# Patient Record
Sex: Male | Born: 1940 | Race: White | Hispanic: No | Marital: Married | Smoking: Never smoker
Health system: Southern US, Community
[De-identification: ages and names within clinical notes are randomized; demographics above are authoritative.]

## PROBLEM LIST (undated history)

## (undated) DIAGNOSIS — E785 Hyperlipidemia, unspecified: Secondary | ICD-10-CM

## (undated) DIAGNOSIS — I499 Cardiac arrhythmia, unspecified: Secondary | ICD-10-CM

## (undated) DIAGNOSIS — C801 Malignant (primary) neoplasm, unspecified: Secondary | ICD-10-CM

## (undated) DIAGNOSIS — N400 Enlarged prostate without lower urinary tract symptoms: Secondary | ICD-10-CM

## (undated) DIAGNOSIS — N189 Chronic kidney disease, unspecified: Secondary | ICD-10-CM

## (undated) DIAGNOSIS — T7840XA Allergy, unspecified, initial encounter: Secondary | ICD-10-CM

## (undated) DIAGNOSIS — D649 Anemia, unspecified: Secondary | ICD-10-CM

## (undated) DIAGNOSIS — K5790 Diverticulosis of intestine, part unspecified, without perforation or abscess without bleeding: Secondary | ICD-10-CM

## (undated) DIAGNOSIS — I10 Essential (primary) hypertension: Secondary | ICD-10-CM

## (undated) HISTORY — DX: Essential (primary) hypertension: I10

## (undated) HISTORY — DX: Diverticulosis of intestine, part unspecified, without perforation or abscess without bleeding: K57.90

## (undated) HISTORY — DX: Chronic kidney disease, unspecified: N18.9

## (undated) HISTORY — PX: TONSILLECTOMY: SUR1361

## (undated) HISTORY — DX: Hyperlipidemia, unspecified: E78.5

## (undated) HISTORY — DX: Cardiac arrhythmia, unspecified: I49.9

## (undated) HISTORY — DX: Benign prostatic hyperplasia without lower urinary tract symptoms: N40.0

## (undated) HISTORY — DX: Anemia, unspecified: D64.9

## (undated) HISTORY — DX: Malignant (primary) neoplasm, unspecified: C80.1

## (undated) HISTORY — DX: Allergy, unspecified, initial encounter: T78.40XA

## (undated) HISTORY — PX: SKIN CANCER EXCISION: SHX779

---

## 2008-04-29 ENCOUNTER — Ambulatory Visit: Payer: Self-pay | Admitting: Internal Medicine

## 2008-05-22 ENCOUNTER — Ambulatory Visit: Payer: Self-pay | Admitting: Internal Medicine

## 2010-06-08 ENCOUNTER — Emergency Department (HOSPITAL_COMMUNITY)
Admission: EM | Admit: 2010-06-08 | Discharge: 2010-06-08 | Payer: Self-pay | Source: Home / Self Care | Admitting: Emergency Medicine

## 2010-06-16 ENCOUNTER — Encounter
Admission: RE | Admit: 2010-06-16 | Discharge: 2010-06-16 | Payer: Self-pay | Source: Home / Self Care | Attending: Internal Medicine | Admitting: Internal Medicine

## 2010-07-21 NOTE — Procedures (Signed)
Summary: Colonoscopy   Colonoscopy  Procedure date:  05/22/2008  Findings:      Location:  [facility].    Procedures Next Due Date:    Colonoscopy: 05/2013  Patient Name: Patrick Rios, Patrick Rios MRN:  Procedure Procedures: Colonoscopy CPT: 40981.  Personnel: Endoscopist: Wilhemina Bonito. Marina Goodell, MD.  Referred By: Barry Dienes Eloise Harman, MD.  Exam Location: Exam performed in Outpatient Clinic. Outpatient  Patient Consent: Procedure, Alternatives, Risks and Benefits discussed, consent obtained, from patient. Consent was obtained by the RN.  Indications  Surveillance of: Reports colonoscopies since age 66. Last one about 6 yrs ago at [facility]. Reports polyps and diverticulosis. No reports available for review.  History  Current Medications: Patient is not currently taking Coumadin.  Pre-Exam Physical: Performed May 22, 2008. Cardio-pulmonary exam, Rectal exam, HEENT exam , Abdominal exam, Mental status exam WNL.  Comments: Pt. history reviewed/updated, physical exam performed prior to initiation of sedation?yes Exam Exam: Extent of exam reached: Cecum, extent intended: Cecum.  The cecum was identified by appendiceal orifice and IC valve. Patient position: on left side. Time to Cecum: 00:03:48. Time for Withdrawl: 00:09:58. Colon retroflexion performed. Images taken. ASA Classification: II. Tolerance: excellent.  Monitoring: Pulse and BP monitoring, Oximetry used. Supplemental O2 given.  Colon Prep Used Movi prep for colon prep. Prep results: excellent.  Sedation Meds: Patient assessed and found to be appropriate for moderate (conscious) sedation. Fentanyl 75 mcg. given IV. Versed 8 mg. given IV.  Findings - DIVERTICULOSIS: Cecum to Sigmoid Colon. ICD9: Diverticulosis, Colon: 562.10. Comments: marked changes.  NORMAL EXAM: Cecum.  NORMAL EXAM: Rectum.   Assessment  Diagnoses: 562.10: Diverticulosis, Colon. marked changes.   Comments: NO POLYPS  SEEN Events  Unplanned Interventions: No intervention was required.  Unplanned Events: There were no complications. Plans Disposition: After procedure patient sent to recovery. After recovery patient sent home.  Scheduling/Referral: Colonoscopy, to Wilhemina Bonito. Marina Goodell, MD, IN 5 YEARS (HX POLYPS, DIVERTICULOSIS),   Comments: PATIENT SHOULD TRY TO GET OLD COLONOSCOPY/PATH RECORDS FOR MY REVIEW   cc: Barry Dienes. Eloise Harman, MD  This report was created from the original endoscopy report, which was reviewed and signed by the above listed endoscopist.

## 2010-07-21 NOTE — Miscellaneous (Signed)
Summary: previsit  Clinical Lists Changes  Medications: Added new medication of MOVIPREP 100 GM  SOLR (PEG-KCL-NACL-NASULF-NA ASC-C) As per prep instructions. - Signed Rx of MOVIPREP 100 GM  SOLR (PEG-KCL-NACL-NASULF-NA ASC-C) As per prep instructions.;  #1 x 0;  Signed;  Entered by: Burman Foster RN;  Authorized by: Hilarie Fredrickson MD;  Method used: Electronically to [facility] [address] [place], [place]  [id number], Ph: [phone], Fax: [phone]    Prescriptions: MOVIPREP 100 GM  SOLR (PEG-KCL-NACL-NASULF-NA ASC-C) As per prep instructions.  #1 x 0   Entered by:   Burman Foster RN   Authorized by:   Hilarie Fredrickson MD   Signed by:   Burman Foster RN on 04/29/2008   Method used:   Electronically to        [facility]* (retail)       [address]       [place], [place]  [id number]       Ph: [phone]       Fax: [phone]   RxID:   [id number]

## 2010-08-31 LAB — DIFFERENTIAL
Basophils Absolute: 0 10*3/uL (ref 0.0–0.1)
Basophils Relative: 1 % (ref 0–1)
Eosinophils Absolute: 0.2 10*3/uL (ref 0.0–0.7)
Eosinophils Relative: 4 % (ref 0–5)
Lymphocytes Relative: 35 % (ref 12–46)
Lymphs Abs: 2 10*3/uL (ref 0.7–4.0)
Monocytes Absolute: 0.6 10*3/uL (ref 0.1–1.0)
Monocytes Relative: 10 % (ref 3–12)
Neutro Abs: 3 10*3/uL (ref 1.7–7.7)
Neutrophils Relative %: 51 % (ref 43–77)

## 2010-08-31 LAB — BASIC METABOLIC PANEL
BUN: 12 mg/dL (ref 6–23)
CO2: 25 mEq/L (ref 19–32)
Calcium: 9.2 mg/dL (ref 8.4–10.5)
Chloride: 111 mEq/L (ref 96–112)
Creatinine, Ser: 0.84 mg/dL (ref 0.4–1.5)
GFR calc Af Amer: >60 mL/min (ref >60)
GFR calc non Af Amer: >60 mL/min (ref >60)
Glucose, Bld: 88 mg/dL (ref 70–99)
Potassium: 3.9 mEq/L (ref 3.5–5.1)
Sodium: 142 mEq/L (ref 135–145)

## 2010-08-31 LAB — CBC
HCT: 33.8 % — ABNORMAL LOW (ref 39.0–52.0)
Hemoglobin: 11.3 g/dL — ABNORMAL LOW (ref 13.0–17.0)
MCH: 30.8 pg (ref 26.0–34.0)
MCHC: 33.4 g/dL (ref 30.0–36.0)
MCV: 92.1 fL (ref 78.0–100.0)
Platelets: 216 10*3/uL (ref 150–400)
RBC: 3.67 MIL/uL — ABNORMAL LOW (ref 4.22–5.81)
RDW: 13.7 % (ref 11.5–15.5)
WBC: 5.8 10*3/uL (ref 4.0–10.5)

## 2011-03-01 ENCOUNTER — Encounter: Payer: Self-pay | Admitting: Internal Medicine

## 2011-04-08 ENCOUNTER — Other Ambulatory Visit (INDEPENDENT_AMBULATORY_CARE_PROVIDER_SITE_OTHER): Payer: BC Managed Care – PPO

## 2011-04-08 ENCOUNTER — Encounter: Payer: Self-pay | Admitting: Internal Medicine

## 2011-04-08 ENCOUNTER — Ambulatory Visit (INDEPENDENT_AMBULATORY_CARE_PROVIDER_SITE_OTHER): Payer: BC Managed Care – PPO | Admitting: Internal Medicine

## 2011-04-08 VITALS — BP 126/68 | HR 64 | Ht 66.0 in | Wt 162.0 lb

## 2011-04-08 DIAGNOSIS — K5730 Diverticulosis of large intestine without perforation or abscess without bleeding: Secondary | ICD-10-CM

## 2011-04-08 DIAGNOSIS — R1084 Generalized abdominal pain: Secondary | ICD-10-CM

## 2011-04-08 DIAGNOSIS — Z1389 Encounter for screening for other disorder: Secondary | ICD-10-CM

## 2011-04-08 DIAGNOSIS — Z86010 Personal history of colonic polyps: Secondary | ICD-10-CM

## 2011-04-08 DIAGNOSIS — K5790 Diverticulosis of intestine, part unspecified, without perforation or abscess without bleeding: Secondary | ICD-10-CM

## 2011-04-08 LAB — BASIC METABOLIC PANEL
BUN: 16 mg/dL (ref 6–23)
CO2: 27 mEq/L (ref 19–32)
Calcium: 9.8 mg/dL (ref 8.4–10.5)
Chloride: 106 mEq/L (ref 96–112)
Creatinine, Ser: 0.8 mg/dL (ref 0.4–1.5)
GFR: 103.32 mL/min (ref >60.00)
Glucose, Bld: 85 mg/dL (ref 70–99)
Potassium: 4.2 mEq/L (ref 3.5–5.1)
Sodium: 141 mEq/L (ref 135–145)

## 2011-04-08 NOTE — Progress Notes (Signed)
HISTORY OF PRESENT ILLNESS:  Patrick Rios is a 70 y.o. male with hypertension, hyperlipidemia, and BPH. He presents today regarding chronic abdominal discomfort. Patient was seen on one occasion, 05/22/2008, for screening colonoscopy. At that time, he reported a history of polyps 6 years previous while living in [place]. This most recent colonoscopy revealed marked diverticulosis, but no polyps. Followup in 5 years recommended. The patient reports a chronic history of right-sided abdominal discomfort. He apparently underwent extensive workup while living in [place]. His pain was said to be musculoskeletal. Over the past 8-12 months he reports similar pain with increased frequency and a bit more constant. The discomfort is right-sided with some tenderness and fullness. He reports regular bowel habits. He does take Metamucil regularly. No weight loss or bleeding. No fevers. Outside laboratories from September of 2012 revealed normal comprehensive metabolic panel. Normal CBC except for hemoglobin of 11.7 (11.3 one year previous). Normal urinalysis and PSA. Stool was Hemoccult negative. Patient feels that his discomfort is probably the same as he has had for years though wishes further investigation due to increased frequency. He also reports some migration to the right side and flank area. No hematuria.  REVIEW OF SYSTEMS:  All non-GI ROS negative except for muscle cramps and back pain  Past Medical History  Diagnosis Date  . Hypertension   . Hyperlipidemia   . BPH (benign prostatic hyperplasia)   . Arrhythmia   . Diverticulosis     No past surgical history on file.  Social History Patrick Rios  reports that he has never smoked. He has never used smokeless tobacco. He reports that he drinks alcohol. He reports that he does not use illicit drugs.  family history is not on file.  Allergies  Allergen Reactions  . Aspirin Rash    High dose       PHYSICAL EXAMINATION: Vital  signs: BP 126/68  Pulse 64  Ht 5\' 6"  (1.676 m)  Wt 162 lb (73.483 kg)  BMI 26.15 kg/m2  Constitutional: generally well-appearing, no acute distress Psychiatric: alert and oriented x3, cooperative Eyes: extraocular movements intact, anicteric, conjunctiva pink Mouth: oral pharynx moist, no lesions Neck: supple no lymphadenopathy Cardiovascular: heart regular rate and rhythm, no murmur Lungs: clear to auscultation bilaterally Abdomen: soft, nontender, nondistended, no obvious ascites, no peritoneal signs, normal bowel sounds, no organomegaly Rectal: Ommitted Extremities: no lower extremity edema bilaterally Skin: no lesions on visible extremities Neuro: No focal deficits. No asterixis.     ASSESSMENT:  #1. Chronic right-sided abdominal discomfort. Today without worrisome features. Previous colonoscopy as described. It may be musculoskeletal in nature. Possibly diverticular spasm. #2. Pandiverticulosis #3. Remote history of polyps, type unknown   PLAN:  #1. Contrast-enhanced CT scan of the abdomen and pelvis. If no significant abnormality, then provide reassurance and follow expectantly

## 2011-04-08 NOTE — Patient Instructions (Signed)
  You have been scheduled for a CT scan of the abdomen and pelvis at [facility] ([address]---this is in the same building as [facility]ural [facility]).   You are scheduled on 04/12/11 at 2:00 pm. You should arrive 15 minutes prior to your appointment time for registration. Please follow the written instructions below on the day of your exam:  WARNING: IF YOU ARE ALLERGIC TO IODINE/X-RAY DYE, PLEASE NOTIFY RADIOLOGY IMMEDIATELY AT [phone]! YOU WILL BE GIVEN A 13 HOUR PREMEDICATION PREP.  1) Do not eat or drink anything after 10:00 am (4 hours prior to your test) 2) You have been given 2 bottles of oral contrast to drink. The solution may taste better if refrigerated, but do NOT add ice or any other liquid to this solution. Shake well before drinking.    Drink 1 bottle of contrast @ 12 noon (2 hours prior to your exam)  Drink 1 bottle of contrast @ 1:00 pm (1 hour prior to your exam)  You may take any medications as prescribed with a small amount of water except for the following: Metformin, Glucophage, Glucovance, Avandamet, Riomet, Fortamet, Actoplus Met, Janumet, Glumetza or Metaglip. The above medications must be held the day of the exam AND 48 hours after the exam.  The purpose of you drinking the oral contrast is to aid in the visualization of your intestinal tract. The contrast solution may cause some diarrhea. Before your exam is started, you will be given a small amount of fluid to drink. Depending on your individual set of symptoms, you may also receive an intravenous injection of x-ray contrast/dye. Plan on being at [facility] for 30 minutes or long, depending on the type of exam you are having performed.  If you have any questions regarding your exam or if you need to reschedule, you may call the CT department at [phone] between the hours of 8:00 am and 5:00 pm, Monday-Friday.  Go to the basement floor and have labs drawn today  (BUN/Creatinine)    ________________________________________________________________________

## 2011-04-12 ENCOUNTER — Telehealth: Payer: Self-pay

## 2011-04-12 ENCOUNTER — Ambulatory Visit (INDEPENDENT_AMBULATORY_CARE_PROVIDER_SITE_OTHER)
Admission: RE | Admit: 2011-04-12 | Discharge: 2011-04-12 | Disposition: A | Discharge status: Home / Self Care | Payer: BC Managed Care – PPO | Source: Ambulatory Visit | Attending: Internal Medicine | Admitting: Internal Medicine

## 2011-04-12 DIAGNOSIS — R1084 Generalized abdominal pain: Secondary | ICD-10-CM

## 2011-04-12 MED ORDER — IOHEXOL 300 MG/ML  SOLN
100.0000 mL | Freq: Once | INTRAMUSCULAR | Status: AC | PRN
  Administered 2011-04-12: 100 mL via INTRAVENOUS

## 2011-04-12 NOTE — Telephone Encounter (Signed)
Pt aware and would like referral to [facility]. Will call pt back once appt made.

## 2011-04-12 NOTE — Telephone Encounter (Signed)
Message copied by Michele Mcalpine on Mon Apr 12, 2011  4:55 PM ------      Message from: Hilarie Fredrickson      Created: Mon Apr 12, 2011  4:35 PM       Let patient noted that his CT scan looks okay except for the presence of a gallstone. It is possible that his right-sided pain is related to gallstones, though his story is a bit atypical. It might be best, if he is interested, and obtaining a surgical opinion. I would have Dr. Johna Sheriff see him

## 2011-04-13 NOTE — Telephone Encounter (Signed)
Pt scheduled to see Dr. Johna Sheriff 05/06/11 arrival time 8:45am, appt time 9:15am. Pt aware of appt date and time. Appt scheduled with [facility].

## 2011-05-06 ENCOUNTER — Ambulatory Visit (INDEPENDENT_AMBULATORY_CARE_PROVIDER_SITE_OTHER): Payer: BC Managed Care – PPO | Admitting: General Surgery

## 2011-05-06 ENCOUNTER — Encounter (INDEPENDENT_AMBULATORY_CARE_PROVIDER_SITE_OTHER): Payer: Self-pay | Admitting: General Surgery

## 2011-05-06 VITALS — BP 122/68 | HR 60 | Temp 97.4°F | Resp 16 | Ht 67.0 in | Wt 162.1 lb

## 2011-05-06 DIAGNOSIS — R1011 Right upper quadrant pain: Secondary | ICD-10-CM

## 2011-05-06 DIAGNOSIS — K8020 Calculus of gallbladder without cholecystitis without obstruction: Secondary | ICD-10-CM

## 2011-05-06 NOTE — Progress Notes (Signed)
Subjective:   Right upper quadrant abdominal pain  Patient ID: Patrick Rios, male   DOB: Oct 23, 1940, 70 y.o.   MRN: [id number]  HPI Patient is a 70 year old male here through the courtesy of Dr. Marina Goodell for abdominal pain and gallstones. The patient states that actually for a number of years he has had mild occasional aching pain in his right upper quadrant.However in recent months this has gradually worsened. He describes fairly constant aching discomfort in his right upper quadrant that radiates around to his right flank and down to his lower abdomen. It is never severe. It is worse with eating.He has some gas and bloating. No nausea or vomiting.Bowel movements are okay and he had a recent colonoscopy within the last couple of years.The patient underwent CT scan to evaluate the pain which has revealed cholelithiasis without any complicating factors.  Past Medical History  Diagnosis Date  . Hypertension   . Hyperlipidemia   . BPH (benign prostatic hyperplasia)   . Arrhythmia   . Diverticulosis   . Anemia     borderline   Past Surgical History  Procedure Date  . Tonsillectomy    Current Outpatient Prescriptions  Medication Sig Dispense Refill  . Ascorbic Acid (VITAMIN C) 100 MG tablet Take 500 mg by mouth daily.       Marland Kitchen aspirin 81 MG tablet Take 81 mg by mouth daily.        . B Complex Vitamins (VITAMIN-B COMPLEX PO) Take by mouth.        . Calcium Carbonate-Vitamin D (CALTRATE 600+D) 600-400 MG-UNIT per tablet Take 1 tablet by mouth daily.        . cetirizine (ZYRTEC) 10 MG chewable tablet Chew 10 mg by mouth daily.        . Cholecalciferol (VITAMIN D) 2000 UNITS CAPS Take 1 capsule by mouth 2 (two) times daily.        . fenofibrate (TRICOR) 145 MG tablet Take 145 mg by mouth daily.        . Garlic 2000 MG CAPS Take 1 capsule by mouth daily.        Marland Kitchen lisinopril (PRINIVIL,ZESTRIL) 2.5 MG tablet Take 2.5 mg by mouth daily.        . metoprolol (TOPROL-XL) 50 MG 24 hr tablet Take 50 mg by  mouth daily.        . Multiple Vitamins-Iron (DAILY MULTIVITAMINS/IRON PO) Take 1 tablet by mouth daily.        . saw palmetto 160 MG capsule Take 160 mg by mouth 2 (two) times daily.        . simvastatin (ZOCOR) 20 MG tablet        Allergies  Allergen Reactions  . Aspirin Rash    High dose   History  Substance Use Topics  . Smoking status: Never Smoker   . Smokeless tobacco: Never Used  . Alcohol Use: 7.0 oz/week    14 drink(s) per week     Review of Systems  Constitutional: Negative for fever, fatigue and unexpected weight change.  HENT: Negative.   Respiratory: Negative.   Cardiovascular: Positive for palpitations. Negative for chest pain and leg swelling.  Gastrointestinal: Positive for abdominal pain and abdominal distention. Negative for nausea, vomiting, diarrhea, constipation and blood in stool.  Genitourinary: Negative.   Musculoskeletal: Negative.        Objective:   Physical Exam General: Normal weight Caucasian male in no distress. Skin: Warm and dry without rash or infection HEENT: No palpable masses  or thyromegaly. Sclera nonicteric. Lymph nodes: No cervical, supraclavicular, or inguinal nodes palpable Lungs: Clear without wheezing or increased work of breathing Cardiovascular: Regular rate and rhythm. No murmur. No JVD or edema. Peripheral pulses intact. Abdomen: Moderate diastases. Mild right upper quadrant tenderness to deep palpation. No discernible masses or organomegaly. No hernias expected. Extremities: No joint swelling deformity or edema Neurologic: Alert and fully oriented. Gait normal.      Assessment:     70 year old male with persistent and worsening right upper quadrant aching discomfort worse with meals. This appears consistent with chronic cholecystitis/biliary colic and he has gallstones confirmed on CT and no other source identified for his pain. I believe he would be best served with laparoscopic cholecystectomy in an effort to relieve  his symptoms and prevent complications from his gallstones. I discussed this with the patient including the nature of the procedure, indications, and risks of anesthetic complications, bleeding, infection, possibly for open procedure, bile leak, bile duct injury and others. He was given literature during the procedure. He is in agreement and will schedule this at his convenience.    Plan:     Laparoscopic cholecystectomy with cholangiogram under general anesthesia as an outpatient.

## 2011-05-06 NOTE — Patient Instructions (Signed)

## 2011-06-08 ENCOUNTER — Other Ambulatory Visit (INDEPENDENT_AMBULATORY_CARE_PROVIDER_SITE_OTHER): Payer: Self-pay | Admitting: General Surgery

## 2011-06-08 DIAGNOSIS — K8010 Calculus of gallbladder with chronic cholecystitis without obstruction: Secondary | ICD-10-CM

## 2011-06-08 HISTORY — PX: CHOLECYSTECTOMY: SHX55

## 2011-07-01 ENCOUNTER — Ambulatory Visit (INDEPENDENT_AMBULATORY_CARE_PROVIDER_SITE_OTHER): Payer: BC Managed Care – PPO | Admitting: General Surgery

## 2011-07-01 ENCOUNTER — Encounter (INDEPENDENT_AMBULATORY_CARE_PROVIDER_SITE_OTHER): Payer: Self-pay | Admitting: General Surgery

## 2011-07-01 VITALS — BP 128/72 | HR 56 | Temp 97.6°F | Resp 12 | Ht 67.0 in | Wt 148.6 lb

## 2011-07-01 DIAGNOSIS — Z09 Encounter for follow-up examination after completed treatment for conditions other than malignant neoplasm: Secondary | ICD-10-CM

## 2011-07-01 NOTE — Progress Notes (Signed)
Patient returns 3 weeks following elective laparoscopic cholecystectomy for chronic cholecystitis. He is getting along very well. He feels that his preoperative pain has been completely relieved. He is getting back to normal activity and not having pain, nausea, or fever.  Pathology showed cholesterolosis and chronic cholecystitis.  On exam he appears well. Abdomen is soft and nontender. Wounds well healed.  Assessment and plan: Doing well following laparoscopic cholecystectomy with relief of symptoms and no complications. No restrictions at this point. Return p.r.n.

## 2011-08-20 ENCOUNTER — Encounter (INDEPENDENT_AMBULATORY_CARE_PROVIDER_SITE_OTHER): Payer: Self-pay | Admitting: General Surgery

## 2011-11-30 ENCOUNTER — Encounter: Payer: Self-pay | Admitting: Cardiovascular Disease

## 2012-03-26 ENCOUNTER — Emergency Department (HOSPITAL_COMMUNITY)
Admission: EM | Admit: 2012-03-26 | Discharge: 2012-03-26 | Disposition: A | Discharge status: Home / Self Care | Payer: MEDICARE | Attending: Emergency Medicine | Admitting: Emergency Medicine

## 2012-03-26 ENCOUNTER — Encounter (HOSPITAL_COMMUNITY): Payer: Self-pay | Admitting: *Deleted

## 2012-03-26 ENCOUNTER — Emergency Department (HOSPITAL_COMMUNITY): Payer: MEDICARE

## 2012-03-26 DIAGNOSIS — Z79899 Other long term (current) drug therapy: Secondary | ICD-10-CM | POA: Insufficient documentation

## 2012-03-26 DIAGNOSIS — N201 Calculus of ureter: Secondary | ICD-10-CM | POA: Insufficient documentation

## 2012-03-26 DIAGNOSIS — I10 Essential (primary) hypertension: Secondary | ICD-10-CM | POA: Insufficient documentation

## 2012-03-26 DIAGNOSIS — N138 Other obstructive and reflux uropathy: Secondary | ICD-10-CM | POA: Insufficient documentation

## 2012-03-26 DIAGNOSIS — N401 Enlarged prostate with lower urinary tract symptoms: Secondary | ICD-10-CM | POA: Insufficient documentation

## 2012-03-26 DIAGNOSIS — N1330 Unspecified hydronephrosis: Secondary | ICD-10-CM | POA: Insufficient documentation

## 2012-03-26 DIAGNOSIS — Z9089 Acquired absence of other organs: Secondary | ICD-10-CM | POA: Insufficient documentation

## 2012-03-26 DIAGNOSIS — E785 Hyperlipidemia, unspecified: Secondary | ICD-10-CM | POA: Insufficient documentation

## 2012-03-26 LAB — COMPREHENSIVE METABOLIC PANEL
ALT: 15 U/L (ref 0–53)
AST: 34 U/L (ref 0–37)
Albumin: 4.4 g/dL (ref 3.5–5.2)
Alkaline Phosphatase: 43 U/L (ref 39–117)
BUN: 18 mg/dL (ref 6–23)
CO2: 22 mEq/L (ref 19–32)
Calcium: 10.4 mg/dL (ref 8.4–10.5)
Chloride: 108 mEq/L (ref 96–112)
Creatinine, Ser: 0.69 mg/dL (ref 0.50–1.35)
GFR calc Af Amer: >90 mL/min (ref >90)
GFR calc non Af Amer: >90 mL/min (ref >90)
Glucose, Bld: 93 mg/dL (ref 70–99)
Potassium: 4.2 mEq/L (ref 3.5–5.1)
Sodium: 142 mEq/L (ref 135–145)
Total Bilirubin: 0.5 mg/dL (ref 0.3–1.2)
Total Protein: 7.3 g/dL (ref 6.0–8.3)

## 2012-03-26 LAB — CBC WITH DIFFERENTIAL/PLATELET
Basophils Absolute: 0 10*3/uL (ref 0.0–0.1)
Basophils Absolute: 0 10*3/uL (ref 0.0–0.1)
Basophils Relative: 0 % (ref 0–1)
Basophils Relative: 0 % (ref 0–1)
Eosinophils Absolute: 0.1 10*3/uL (ref 0.0–0.7)
Eosinophils Absolute: 0.1 10*3/uL (ref 0.0–0.7)
Eosinophils Relative: 2 % (ref 0–5)
Eosinophils Relative: 2 % (ref 0–5)
HCT: 35.4 % — ABNORMAL LOW (ref 39.0–52.0)
HCT: 34.5 % — ABNORMAL LOW (ref 39.0–52.0)
Hemoglobin: 12 g/dL — ABNORMAL LOW (ref 13.0–17.0)
Hemoglobin: 11.6 g/dL — ABNORMAL LOW (ref 13.0–17.0)
Lymphocytes Relative: 23 % (ref 12–46)
Lymphocytes Relative: 28 % (ref 12–46)
Lymphs Abs: 2 10*3/uL (ref 0.7–4.0)
Lymphs Abs: 2.4 10*3/uL (ref 0.7–4.0)
MCH: 31.1 pg (ref 26.0–34.0)
MCH: 30.9 pg (ref 26.0–34.0)
MCHC: 33.9 g/dL (ref 30.0–36.0)
MCHC: 33.6 g/dL (ref 30.0–36.0)
MCV: 91.7 fL (ref 78.0–100.0)
MCV: 91.8 fL (ref 78.0–100.0)
Monocytes Absolute: 0.7 10*3/uL (ref 0.1–1.0)
Monocytes Absolute: 0.8 10*3/uL (ref 0.1–1.0)
Monocytes Relative: 7 % (ref 3–12)
Monocytes Relative: 10 % (ref 3–12)
Neutro Abs: 6.1 10*3/uL (ref 1.7–7.7)
Neutro Abs: 5 10*3/uL (ref 1.7–7.7)
Neutrophils Relative %: 68 % (ref 43–77)
Neutrophils Relative %: 60 % (ref 43–77)
Platelets: 223 10*3/uL (ref 150–400)
Platelets: 189 10*3/uL (ref 150–400)
RBC: 3.86 MIL/uL — ABNORMAL LOW (ref 4.22–5.81)
RBC: 3.76 MIL/uL — ABNORMAL LOW (ref 4.22–5.81)
RDW: 13.5 % (ref 11.5–15.5)
RDW: 13.3 % (ref 11.5–15.5)
WBC: 8.9 10*3/uL (ref 4.0–10.5)
WBC: 8.3 10*3/uL (ref 4.0–10.5)

## 2012-03-26 LAB — BASIC METABOLIC PANEL
BUN: 16 mg/dL (ref 6–23)
CO2: 24 mEq/L (ref 19–32)
Calcium: 10.1 mg/dL (ref 8.4–10.5)
Chloride: 106 mEq/L (ref 96–112)
Creatinine, Ser: 0.68 mg/dL (ref 0.50–1.35)
GFR calc Af Amer: >90 mL/min (ref >90)
GFR calc non Af Amer: >90 mL/min (ref >90)
Glucose, Bld: 90 mg/dL (ref 70–99)
Potassium: 3.7 mEq/L (ref 3.5–5.1)
Sodium: 141 mEq/L (ref 135–145)

## 2012-03-26 MED ORDER — OXYCODONE-ACETAMINOPHEN 5-325 MG PO TABS: 1.0000 | ORAL_TABLET | Freq: Four times a day (QID) | ORAL | Status: DC | PRN

## 2012-03-26 MED ORDER — IOHEXOL 300 MG/ML  SOLN
100.0000 mL | Freq: Once | INTRAMUSCULAR | Status: AC | PRN
  Administered 2012-03-26: 100 mL via INTRAVENOUS

## 2012-03-26 MED ORDER — SODIUM CHLORIDE 0.9 % IV SOLN: INTRAVENOUS | Status: DC

## 2012-03-26 MED ORDER — ONDANSETRON HCL 4 MG/2ML IJ SOLN
4.0000 mg | Freq: Once | INTRAMUSCULAR | Status: AC
  Administered 2012-03-26: 4 mg via INTRAVENOUS
  Filled 2012-03-26: qty 2

## 2012-03-26 MED ORDER — TAMSULOSIN HCL 0.4 MG PO CAPS: 0.4000 mg | ORAL_CAPSULE | Freq: Every day | ORAL | Status: DC

## 2012-03-26 MED ORDER — MORPHINE SULFATE 4 MG/ML IJ SOLN
4.0000 mg | Freq: Once | INTRAMUSCULAR | Status: AC
  Administered 2012-03-26: 4 mg via INTRAVENOUS
  Filled 2012-03-26: qty 1

## 2012-03-26 MED ORDER — SODIUM CHLORIDE 0.9 % IV BOLUS (SEPSIS)
500.0000 mL | Freq: Once | INTRAVENOUS | Status: AC
  Administered 2012-03-26: 500 mL via INTRAVENOUS

## 2012-03-26 NOTE — ED Provider Notes (Signed)
History     CSN: [id number]  Arrival date & time 03/26/12  1031   First MD Initiated Contact with Patient 03/26/12 1248      Chief Complaint  Patient presents with  . Abdominal Pain    (Consider location/radiation/quality/duration/timing/severity/associated sxs/prior treatment) Patient is a 71 y.o. male presenting with abdominal pain.  Abdominal Pain The primary symptoms of the illness include abdominal pain and nausea. The primary symptoms of the illness do not include shortness of breath, vomiting, diarrhea or dysuria.  Additional symptoms associated with the illness include constipation. Symptoms associated with the illness do not include back pain.  patient developed right-sided abdominal pain yesterday. It is dull. It is worse in the lower abdomen. No fevers. No vomiting. He has had some constipation and nausea.he's had a previous cholecystectomy. He still has his appendix.  Past Medical History  Diagnosis Date  . Hypertension   . Hyperlipidemia   . BPH (benign prostatic hyperplasia)   . Arrhythmia   . Diverticulosis   . Anemia     borderline    Past Surgical History  Procedure Date  . Tonsillectomy   . Cholecystectomy 06/08/11    History reviewed. No pertinent family history.  History  Substance Use Topics  . Smoking status: Never Smoker   . Smokeless tobacco: Never Used  . Alcohol Use: 7.0 oz/week    14 drink(s) per week      Review of Systems  Constitutional: Positive for appetite change. Negative for activity change.  HENT: Negative for neck stiffness.   Eyes: Negative for pain.  Respiratory: Negative for chest tightness and shortness of breath.   Cardiovascular: Negative for chest pain and leg swelling.  Gastrointestinal: Positive for nausea, abdominal pain and constipation. Negative for vomiting and diarrhea.  Genitourinary: Negative for dysuria and flank pain.  Musculoskeletal: Negative for back pain.  Skin: Negative for rash.  Neurological:  Negative for weakness, numbness and headaches.  Psychiatric/Behavioral: Negative for behavioral problems.    Allergies  Aspirin  Home Medications   Current Outpatient Rx  Name Route Sig Dispense Refill  . VITAMIN C 100 MG PO TABS Oral Take 500 mg by mouth daily.     . ASPIRIN 81 MG PO TABS Oral Take 81 mg by mouth daily.      Marland Kitchen VITAMIN-B COMPLEX PO Oral Take 1 tablet by mouth daily.     Marland Kitchen CALCIUM CARBONATE-VITAMIN D 600-400 MG-UNIT PO TABS Oral Take 1 tablet by mouth daily.     Marland Kitchen CETIRIZINE HCL 10 MG PO CHEW Oral Chew 10 mg by mouth daily.      Marland Kitchen VITAMIN D 2000 UNITS PO CAPS Oral Take 1 capsule by mouth daily.     . FENOFIBRATE 145 MG PO TABS Oral Take 145 mg by mouth daily.      Marland Kitchen GARLIC 2000 MG PO CAPS Oral Take 1 capsule by mouth daily.      Marland Kitchen LISINOPRIL 2.5 MG PO TABS Oral Take 2.5 mg by mouth daily.      Marland Kitchen METOPROLOL SUCCINATE ER 50 MG PO TB24 Oral Take 50 mg by mouth daily.      Marland Kitchen DAILY MULTIVITAMINS/IRON PO Oral Take 1 tablet by mouth daily.      . SAW PALMETTO (SERENOA REPENS) 160 MG PO CAPS Oral Take 160 mg by mouth daily.     Marland Kitchen SIMVASTATIN 20 MG PO TABS Oral Take 20 mg by mouth daily.     . OXYCODONE-ACETAMINOPHEN 5-325 MG PO TABS Oral Take  1-2 tablets by mouth every 6 (six) hours as needed for pain. 20 tablet 0  . TAMSULOSIN HCL 0.4 MG PO CAPS Oral Take 1 capsule (0.4 mg total) by mouth daily. 7 capsule 0    BP 163/81  Pulse 65  Temp 97.7 F (36.5 C) (Oral)  Resp 18  SpO2 97%  Physical Exam  Nursing note and vitals reviewed. Constitutional: He is oriented to person, place, and time. He appears well-developed and well-nourished.  HENT:  Head: Normocephalic and atraumatic.  Eyes: EOM are normal. Pupils are equal, round, and reactive to light.  Neck: Normal range of motion. Neck supple.  Cardiovascular: Normal rate, regular rhythm and normal heart sounds.   No murmur heard. Pulmonary/Chest: Effort normal and breath sounds normal.  Abdominal: Soft. Bowel sounds are  normal. He exhibits no distension and no mass. There is tenderness. There is no rebound and no guarding.       Tenderness to right lower quadrant without rebound or guarding. No rash. No masses.  Musculoskeletal: Normal range of motion. He exhibits no edema.  Neurological: He is alert and oriented to person, place, and time. No cranial nerve deficit.  Skin: Skin is warm and dry.  Psychiatric: He has a normal mood and affect.    ED Course  Procedures (including critical care time)  Labs Reviewed  CBC WITH DIFFERENTIAL - Abnormal; Notable for the following:    RBC 3.86 (*)     Hemoglobin 12.0 (*)     HCT 35.4 (*)     All other components within normal limits  CBC WITH DIFFERENTIAL - Abnormal; Notable for the following:    RBC 3.76 (*)     Hemoglobin 11.6 (*)     HCT 34.5 (*)     All other components within normal limits  COMPREHENSIVE METABOLIC PANEL  BASIC METABOLIC PANEL  LAB REPORT - SCANNED   Ct Abdomen Pelvis W Contrast  03/26/2012  *RADIOLOGY REPORT*  Clinical Data: Right lower quadrant pain since last night.  Nausea. No vomiting.  Constipation.  Pain.  History of hypertension, cholecystectomy, BPH.  CT ABDOMEN AND PELVIS WITH CONTRAST  Technique:  Multidetector CT imaging of the abdomen and pelvis was performed following the standard protocol during bolus administration of intravenous contrast.  Contrast: OMNIPAQUE IOHEXOL 300 MG/ML  SOLN  Comparison: 04/12/2011  Findings: Lung bases are unremarkable.  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  The ureters join together at the level of L4-5. At the junction of the ureters, there is a 5 mm calculus in the upper pole moiety ureter, causing mild pelvicaliectasis.  No focal abnormality identified within the liver, spleen, pancreas, adrenal glands, or kidneys.  Gallbladder is surgically absent.  The stomach and small bowel loops are normal in appearance. The appendix is well seen and has a normal appearance.  Colonic loops  shows scattered diverticula but no evidence for diverticulitis. No evidence for aortic aneurysm.  There is atherosclerotic calcification of the abdominal aorta however. No retroperitoneal or mesenteric adenopathy.  There are mild degenerative changes in the lower thoracic and lumbar spine. There is prostatic enlargement. Prostate calcifications are present.  Urinary bladder has a normal appearance.  IMPRESSION:  1.  Duplicated right renal collecting system.  There is a 5 mm calculus within the upper pole ureter, at the junction with the lower pole ureter.  There is mild right hydronephrosis. 2.  Normal appendix. 3.  Prostatic enlargement.   Original Report Authenticated By: Patterson Hammersmith,  M.D.      1. Ureteral stone       MDM  Patient presents with lower abdominal pain nausea without vomiting. Also has had some constipation. This morning the pain increased. Urinalysis does not show infection. CT scan showed a 5 mm ureteral calculus. He does have a duplicate collecting system. After discussion of urology patient will followup in the office.        Juliet Rude. Rubin Payor, MD 03/27/12 [phone]

## 2012-03-26 NOTE — ED Notes (Signed)
Pt in c/o RLQ abd pain since last night, also nausea, denies vomiting, states he has been constipated over last few days which is abnormal for him. Pain increased this am.

## 2013-04-11 ENCOUNTER — Encounter: Payer: Self-pay | Admitting: Internal Medicine

## 2013-09-05 ENCOUNTER — Encounter: Payer: Self-pay | Admitting: Cardiovascular Disease

## 2013-11-17 ENCOUNTER — Encounter: Payer: Self-pay | Admitting: Internal Medicine

## 2014-03-07 ENCOUNTER — Encounter: Payer: Self-pay | Admitting: Internal Medicine

## 2014-03-21 ENCOUNTER — Encounter: Payer: Self-pay | Admitting: Internal Medicine

## 2014-05-07 ENCOUNTER — Ambulatory Visit (AMBULATORY_SURGERY_CENTER): Payer: Self-pay | Admitting: *Deleted

## 2014-05-07 VITALS — Ht 65.0 in | Wt 158.2 lb

## 2014-05-07 DIAGNOSIS — Z86010 Personal history of colonic polyps: Secondary | ICD-10-CM

## 2014-05-07 MED ORDER — MOVIPREP 100 G PO SOLR: ORAL | Status: DC

## 2014-05-07 NOTE — Progress Notes (Signed)
No egg or soy allergy  No anesthesia or intubation problems per pt  No diet medications taken  Registered in EMMI   

## 2014-05-21 ENCOUNTER — Ambulatory Visit (AMBULATORY_SURGERY_CENTER): Payer: BC Managed Care – PPO | Admitting: Internal Medicine

## 2014-05-21 ENCOUNTER — Encounter: Payer: Self-pay | Admitting: Internal Medicine

## 2014-05-21 VITALS — BP 130/73 | HR 59 | Temp 98.7°F | Resp 16 | Ht 65.0 in | Wt 158.0 lb

## 2014-05-21 DIAGNOSIS — Z86010 Personal history of colonic polyps: Secondary | ICD-10-CM

## 2014-05-21 MED ORDER — SODIUM CHLORIDE 0.9 % IV SOLN: 500.0000 mL | INTRAVENOUS | Status: DC

## 2014-05-21 NOTE — Op Note (Signed)
[place]  [address]. [place], [address]   COLONOSCOPY PROCEDURE REPORT  PATIENT: Patrick Rios, Patrick Rios  MR#: [id number] BIRTHDATE: 1940-07-28 , 43  yrs. old GENDER: male ENDOSCOPIST: Eustace Quail, MD REFERRED [id number] Program Recall PROCEDURE DATE:  05/21/2014 PROCEDURE:   Colonoscopy, surveillance First Screening Colonoscopy - Avg.  risk and is 50 yrs.  old or older - No.  Prior Negative Screening - Now for repeat screening. N/A  History of Adenoma - Now for follow-up colonoscopy & has been > or = to 3 yrs.  N/A  Polyps Removed Today? No.  Recommend repeat exam, <10 yrs? No. ASA CLASS:   Class II INDICATIONS:surveillance colonoscopy based on a history of colonic polyp(s).   reports multiple prior colonoscopies elswhere and hx of polyps (no details). Last colonoscopy here 05-2008 - no polyps. No family hx MEDICATIONS: Monitored anesthesia care and Propofol 250 mg IV  DESCRIPTION OF PROCEDURE:   After the risks benefits and alternatives of the procedure were thoroughly explained, informed consent was obtained.  The digital rectal exam revealed no abnormalities of the rectum.   The LB ZM-CE022 [id number]  endoscope was introduced through the anus and advanced to the cecum, which was identified by both the appendix and ileocecal valve. No adverse events experienced.   The quality of the prep was excellent, using MoviPrep  The instrument was then slowly withdrawn as the colon was fully examined.    COLON FINDINGS: There was severe diverticulosis noted throughout the entire examined colon.   The examination was otherwise normal. Retroflexed views revealed internal hemorrhoids. The time to cecum=3 minutes 04 seconds.  Withdrawal time=9 minutes 50 seconds. The scope was withdrawn and the procedure completed.  COMPLICATIONS: There were no immediate complications.  ENDOSCOPIC IMPRESSION: 1.   Severe diverticulosis was noted throughout the entire  examined colon 2.   The examination was otherwise normal  RECOMMENDATIONS: 1.  Return to the care of your primary provider.  GI follow up as needed  eSigned:  Eustace Quail, MD 05/21/2014 11:37 AM   cc: Leanna Battles, MD and The Patient

## 2014-05-21 NOTE — Progress Notes (Signed)
Procedure ends, to recovery, report given and VSS. 

## 2014-05-21 NOTE — Patient Instructions (Signed)

## 2014-05-22 ENCOUNTER — Telehealth: Payer: Self-pay

## 2014-05-22 NOTE — Telephone Encounter (Signed)
  Follow up Call-  Call back number 05/21/2014  Post procedure Call Back phone  # [phone]  Permission to leave phone message Yes     Patient questions:  Do you have a fever, pain , or abdominal swelling? No. Pain Score  0 *  Have you tolerated food without any problems? Yes.    Have you been able to return to your normal activities? Yes.    Do you have any questions about your discharge instructions: Diet   No. Medications  No. Follow up visit  No.  Do you have questions or concerns about your Care? No.  Actions: * If pain score is 4 or above: No action needed, pain <4.

## 2015-04-08 ENCOUNTER — Other Ambulatory Visit: Payer: Self-pay | Admitting: Internal Medicine

## 2015-04-08 DIAGNOSIS — M545 Low back pain: Secondary | ICD-10-CM

## 2015-04-21 ENCOUNTER — Ambulatory Visit
Admission: RE | Admit: 2015-04-21 | Discharge: 2015-04-21 | Disposition: A | Discharge status: Home / Self Care | Payer: BLUE CROSS/BLUE SHIELD | Source: Ambulatory Visit | Attending: Internal Medicine | Admitting: Internal Medicine

## 2015-04-21 DIAGNOSIS — M545 Low back pain: Secondary | ICD-10-CM

## 2015-04-25 ENCOUNTER — Other Ambulatory Visit: Payer: Self-pay | Admitting: Internal Medicine

## 2015-04-25 DIAGNOSIS — G8929 Other chronic pain: Secondary | ICD-10-CM

## 2015-04-25 DIAGNOSIS — M545 Low back pain: Principal | ICD-10-CM

## 2016-05-04 ENCOUNTER — Other Ambulatory Visit: Payer: Self-pay | Admitting: Internal Medicine

## 2016-05-04 DIAGNOSIS — R1903 Right lower quadrant abdominal swelling, mass and lump: Secondary | ICD-10-CM

## 2016-05-05 ENCOUNTER — Ambulatory Visit
Admission: RE | Admit: 2016-05-05 | Discharge: 2016-05-05 | Disposition: A | Discharge status: Home / Self Care | Payer: BLUE CROSS/BLUE SHIELD | Source: Ambulatory Visit | Attending: Internal Medicine | Admitting: Internal Medicine

## 2016-05-05 DIAGNOSIS — R1903 Right lower quadrant abdominal swelling, mass and lump: Secondary | ICD-10-CM

## 2016-05-05 MED ORDER — IOPAMIDOL (ISOVUE-300) INJECTION 61%
100.0000 mL | Freq: Once | INTRAVENOUS | Status: AC | PRN
  Administered 2016-05-05: 100 mL via INTRAVENOUS

## 2016-08-17 ENCOUNTER — Other Ambulatory Visit: Payer: Self-pay | Admitting: Internal Medicine

## 2016-08-17 DIAGNOSIS — M5416 Radiculopathy, lumbar region: Secondary | ICD-10-CM

## 2016-08-25 ENCOUNTER — Ambulatory Visit
Admission: RE | Admit: 2016-08-25 | Discharge: 2016-08-25 | Disposition: A | Discharge status: Home / Self Care | Payer: BLUE CROSS/BLUE SHIELD | Source: Ambulatory Visit | Attending: Internal Medicine | Admitting: Internal Medicine

## 2016-08-25 DIAGNOSIS — M5416 Radiculopathy, lumbar region: Secondary | ICD-10-CM

## 2016-08-25 MED ORDER — IOPAMIDOL (ISOVUE-M 200) INJECTION 41%
1.0000 mL | Freq: Once | INTRAMUSCULAR | Status: AC
  Administered 2016-08-25: 1 mL via EPIDURAL

## 2016-08-25 MED ORDER — METHYLPREDNISOLONE ACETATE 40 MG/ML INJ SUSP (RADIOLOG
120.0000 mg | Freq: Once | INTRAMUSCULAR | Status: AC
  Administered 2016-08-25: 120 mg via EPIDURAL

## 2016-08-25 NOTE — Discharge Instructions (Signed)

## 2017-10-19 ENCOUNTER — Telehealth: Payer: Self-pay

## 2017-10-19 NOTE — Telephone Encounter (Signed)
SENT REFERRAL TO SCHEDULING 

## 2017-10-20 ENCOUNTER — Encounter: Payer: Self-pay | Admitting: Cardiovascular Disease

## 2017-10-20 ENCOUNTER — Ambulatory Visit: Payer: Medicare Other | Admitting: Cardiovascular Disease

## 2017-10-20 DIAGNOSIS — R42 Dizziness and giddiness: Secondary | ICD-10-CM | POA: Insufficient documentation

## 2017-10-20 DIAGNOSIS — E7800 Pure hypercholesterolemia, unspecified: Secondary | ICD-10-CM

## 2017-10-20 DIAGNOSIS — I10 Essential (primary) hypertension: Secondary | ICD-10-CM | POA: Insufficient documentation

## 2017-10-20 DIAGNOSIS — E785 Hyperlipidemia, unspecified: Secondary | ICD-10-CM | POA: Insufficient documentation

## 2017-10-20 NOTE — Addendum Note (Signed)
Addended by: Zebedee Iba on: 10/20/2017 03:21 PM   Modules accepted: Orders

## 2017-10-20 NOTE — Progress Notes (Signed)
10/20/2017 Patrick Rios   [birth year]  [id number]  Primary Physician Leanna Battles, MD Primary Cardiologist: Lorretta Harp MD Lupe Carney, [place]  HPI:  Patrick Rios is a 77 y.o. mildly overweight married Caucasian male father of 2 mg father for grandchildren referred by Earle Gell nurse practitioner for bradycardia and dizziness. 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 He apparently saw Dr. Kristopher Oppenheim  10 years ago for "arrhythmia" and a 2-D echo was performed which was normal and he had no issues since.this was factors include treated hypertension and hyperlipidemia. There is no family history. He does not smoke. He stopped raking heavily 3 years ago. He denies chest pain or shortness of breath. Does have hepatic steatosis. He noticed onset of lightheadedness and dizziness several weeks ago. She is exacerbated by certain movements but he has chronic low level baseline dizziness on a daily basis. Does not some orthostatic.   Current Meds  Medication Sig  . aspirin 81 MG tablet Take 81 mg by mouth daily.    . B Complex Vitamins (VITAMIN-B COMPLEX PO) Take 1 tablet by mouth daily.   . Calcium Carbonate-Vitamin D (CALTRATE 600+D) 600-400 MG-UNIT per tablet Take 1 tablet by mouth daily.   . cetirizine (ZYRTEC) 10 MG chewable tablet Chew 10 mg by mouth daily.    . fenofibrate (TRICOR) 145 MG tablet Take 145 mg by mouth daily.    . metoprolol (TOPROL-XL) 50 MG 24 hr tablet Take 50 mg by mouth daily.    . Multiple Vitamins-Iron (DAILY MULTIVITAMINS/IRON PO) Take 1 tablet by mouth daily.    . Psyllium (METAMUCIL PO) Take by mouth 2 (two) times daily.  . simvastatin (ZOCOR) 20 MG tablet Take 20 mg by mouth daily.      No Known Allergies  Social History   Socioeconomic History  . Marital status: Married    Spouse name: Not on file  . Number of children: 2  . Years of education: Not on file  . Highest education level: Not on  file  Occupational History  . Occupation: Retired    Fish farm manager: RETIRED  Social Needs  . Financial resource strain: Not on file  . Food insecurity:    Worry: Not on file    Inability: Not on file  . Transportation needs:    Medical: Not on file    Non-medical: Not on file  Tobacco Use  . Smoking status: Never Smoker  . Smokeless tobacco: Never Used  Substance and Sexual Activity  . Alcohol use: Yes    Alcohol/week: 7.0 oz    Types: 14 Standard drinks or equivalent per week  . Drug use: No  . Sexual activity: Not on file  Lifestyle  . Physical activity:    Days per week: Not on file    Minutes per session: Not on file  . Stress: Not on file  Relationships  . Social connections:    Talks on phone: Not on file    Gets together: Not on file    Attends religious service: Not on file    Active member of club or organization: Not on file    Attends meetings of clubs or organizations: Not on file    Relationship status: Not on file  . Intimate partner violence:    Fear of current or ex partner: Not on file    Emotionally abused: Not on file    Physically abused: Not on file  Forced sexual activity: Not on file  Other Topics Concern  . Not on file  Social History Narrative  . Not on file     Review of Systems: General: negative for chills, fever, night sweats or weight changes.  Cardiovascular: negative for chest pain, dyspnea on exertion, edema, orthopnea, palpitations, paroxysmal nocturnal dyspnea or shortness of breath Dermatological: negative for rash Respiratory: negative for cough or wheezing Urologic: negative for hematuria Abdominal: negative for nausea, vomiting, diarrhea, bright red blood per rectum, melena, or hematemesis Neurologic: negative for visual changes, syncope, or dizziness All other systems reviewed and are otherwise negative except as noted above.    Blood pressure (!) 152/66, pulse (!) 54, height 5\' 6"  (1.676 m), weight 165 lb 6.4 oz (75 kg).    General appearance: alert and no distress Neck: no adenopathy, no carotid bruit, no JVD, supple, symmetrical, trachea midline and thyroid not enlarged, symmetric, no tenderness/mass/nodules Lungs: clear to auscultation bilaterally Heart: regular rate and rhythm, S1, S2 normal, no murmur, click, rub or gallop Extremities: extremities normal, atraumatic, no cyanosis or edema Pulses: 2+ and symmetric Skin: Skin color, texture, turgor normal. No rashes or lesions Neurologic: Alert and oriented X 3, normal strength and tone. Normal symmetric reflexes. Normal coordination and gait  EKG sinus bradycardia 54 without ST or T-wave changes. I personally reviewed this EKG.  ASSESSMENT AND PLAN:   Essential hypertension History of essential hypertension on metoprolol blood pressure measured at 152/66.  Hyperlipidemia History of hyperlipidemia on statin therapy  Dizziness [name] was referred by Earle Gell nurse practitioner at [facility] because of bradycardia and dizziness. He is on a beta blocker with heart rates in the 50s. He's had chronic dizziness for a couple weeks exacerbated by sudden movements. This does not sound orthostatic. It sounds more like an inner ear issue. I do not think he needs further cardiovascular workup at this time I recommended that he see an ENT physician.      Lorretta Harp MD FACP,FACC,FAHA, [facility] 10/20/2017 2:53 PM

## 2017-10-20 NOTE — Patient Instructions (Signed)
Medication Instructions: Your physician recommends that you continue on your current medications as directed. Please refer to the Current Medication list given to you today.   Follow-Up: Your physician recommends that you schedule a follow-up appointment as needed with Dr. Berry.    

## 2017-10-20 NOTE — Assessment & Plan Note (Signed)
Patrick Rios was referred by Earle Gell nurse practitioner at [facility] because of bradycardia and dizziness. He is on a beta blocker with heart rates in the 50s. He's had chronic dizziness for a couple weeks exacerbated by sudden movements. This does not sound orthostatic. It sounds more like an inner ear issue. I do not think he needs further cardiovascular workup at this time I recommended that he see an ENT physician.

## 2017-10-20 NOTE — Assessment & Plan Note (Signed)
History of hyperlipidemia on statin therapy. 

## 2017-10-20 NOTE — Assessment & Plan Note (Signed)
History of essential hypertension on metoprolol blood pressure measured at 152/66.

## 2018-10-24 IMAGING — XA Imaging study
2 series · 2 of 2 positions shown · non-contrast
Comparison: none

CLINICAL DATA: Lumbosacral spondylosis without myelopathy. Right
greater than left low back pain with intermittent lower extremity
pain. Right central disc protrusion at L4-5 on MRI.

[Series 1: ortho standard · 1 of 1 slices shown (1 of 2)]
[im 1/1]
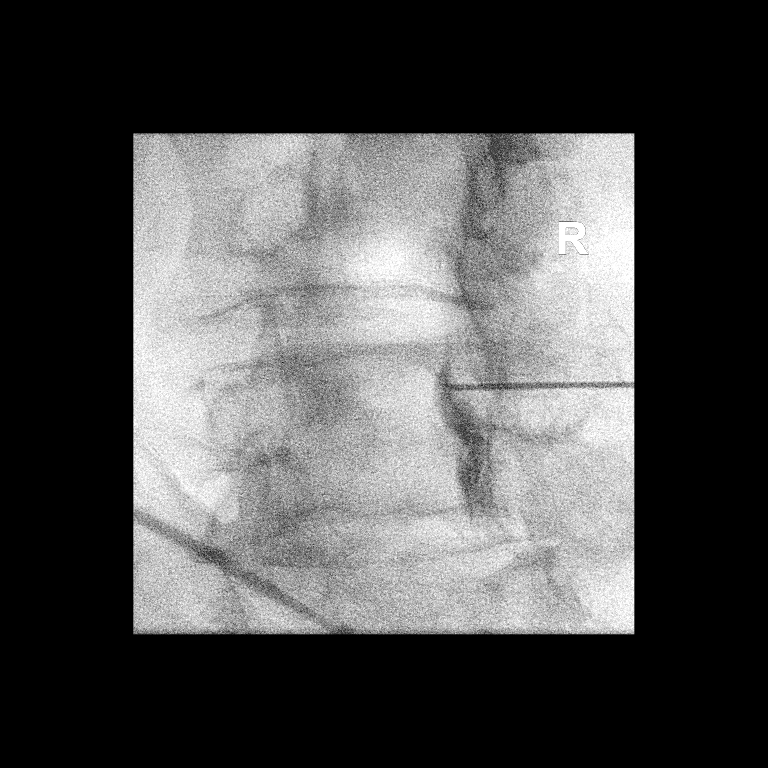

[Series 2: ortho standard · 1 of 1 slices shown (2 of 2)]
[im 1/1]
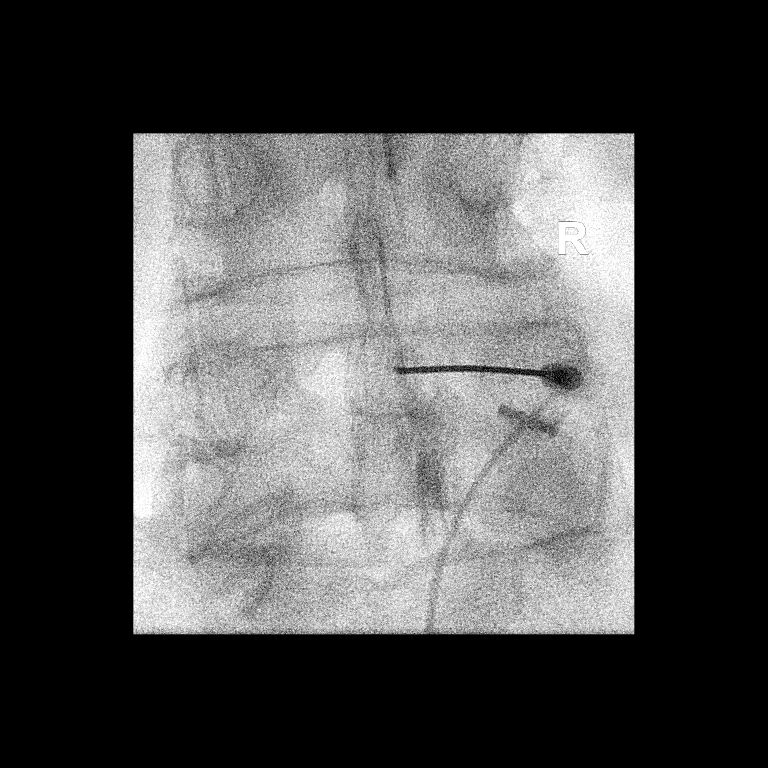

[2 of 2 positions shown; findings below may reference images not displayed]

FLUOROSCOPY TIME:  Radiation Exposure Index (as provided by the
fluoroscopic device): 11.70 microGray*m^2

Fluoroscopy Time (in minutes and seconds):  9 seconds

PROCEDURE:
The procedure, risks, benefits, and alternatives were explained to
the patient. Questions regarding the procedure were encouraged and
answered. The patient understands and consents to the procedure.

LUMBAR EPIDURAL INJECTION:

An interlaminar approach was performed on the right at L4-5. The
overlying skin was cleansed and anesthetized. A 3.5 inch 20 gauge
epidural needle was advanced using loss-of-resistance technique.

DIAGNOSTIC EPIDURAL INJECTION:

Injection of Isovue-M 200 shows a good epidural pattern with spread
above and below the level of needle placement, primarily on the
right. No vascular opacification is seen.

THERAPEUTIC EPIDURAL INJECTION:

120 mg of Depo-Medrol mixed with 3 mL of 1% lidocaine were
instilled. The procedure was well-tolerated, and the patient was
discharged thirty minutes following the injection in good condition.

COMPLICATIONS:
None
IMPRESSION: Technically successful lumbar interlaminar epidural injection on the
right at L4-5.

## 2019-06-20 ENCOUNTER — Telehealth: Payer: Self-pay | Admitting: Cardiovascular Disease

## 2019-06-20 NOTE — Telephone Encounter (Signed)
New Message:     Pt would like to switch from your service to Dr Acie Fredrickson service. Is this alright with you?

## 2019-06-20 NOTE — Telephone Encounter (Signed)
It looks like I have seen him in the past.  This is OK with me.

## 2019-06-20 NOTE — Telephone Encounter (Signed)
New Message:      Pt would like to switch from Dr [name] service to you. Is this alright with you?

## 2019-06-24 NOTE — Telephone Encounter (Signed)
Fine with me

## 2019-06-27 ENCOUNTER — Encounter: Payer: Self-pay | Admitting: Internal Medicine

## 2019-07-24 ENCOUNTER — Telehealth: Payer: Self-pay | Admitting: Cardiovascular Disease

## 2019-07-24 NOTE — Telephone Encounter (Signed)
Close this encounter

## 2019-08-01 ENCOUNTER — Ambulatory Visit: Payer: Medicare Other | Admitting: Internal Medicine

## 2019-08-01 ENCOUNTER — Other Ambulatory Visit: Payer: Self-pay

## 2019-08-01 ENCOUNTER — Encounter: Payer: Self-pay | Admitting: Internal Medicine

## 2019-08-01 VITALS — BP 148/62 | HR 60 | Temp 98.1°F | Ht 65.0 in | Wt 166.0 lb

## 2019-08-01 DIAGNOSIS — Z01818 Encounter for other preprocedural examination: Secondary | ICD-10-CM

## 2019-08-01 DIAGNOSIS — Z86010 Personal history of colonic polyps: Secondary | ICD-10-CM

## 2019-08-01 DIAGNOSIS — R195 Other fecal abnormalities: Secondary | ICD-10-CM

## 2019-08-01 DIAGNOSIS — R1319 Other dysphagia: Secondary | ICD-10-CM

## 2019-08-01 DIAGNOSIS — R1310 Dysphagia, unspecified: Secondary | ICD-10-CM

## 2019-08-01 DIAGNOSIS — R140 Abdominal distension (gaseous): Secondary | ICD-10-CM

## 2019-08-01 DIAGNOSIS — K219 Gastro-esophageal reflux disease without esophagitis: Secondary | ICD-10-CM

## 2019-08-01 MED ORDER — NA SULFATE-K SULFATE-MG SULF 17.5-3.13-1.6 GM/177ML PO SOLN: 1.0000 | Freq: Once | ORAL | 0 refills | Status: AC

## 2019-08-01 NOTE — Progress Notes (Signed)
HISTORY OF PRESENT ILLNESS:  Patrick Rios is a 79 y.o. 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 Philip Aspen regarding Hemoccult positive stool.  Patient has not been seen here since December 2015 when he underwent surveillance colonoscopy.  He had multiple prior colonoscopies in [place] as well as here in 2009.  At the time of his last evaluation he was found to have severe diverticulosis throughout the entire examined colon.  No other abnormalities.  Small internal hemorrhoids.  Because of consecutive negative for neoplasia exams he was felt to have aged out of surveillance.  Part of his routine annual physical evaluation he underwent Hemoccult testing which returned + June 13, 2019.  Review of blood work finds a borderline normal hemoglobin of 12.0.  MCV 93.7.  Patient denies melena.  He notices occasional trivial bright red blood on the tissue about 3 or 4 times per year.  He does have chronic reflux disease for which she is on pantoprazole.  Occasional intermittent solid food dysphagia.  No apparent upper endoscopy previously, here.  He does take baby aspirin but no additional NSAIDs or blood thinners.  Review of outside x-ray file shows CT scan of the abdomen and pelvis with contrast revealed extensive diverticulosis.  Fat-containing inguinal hernias as well.  Hepatic steatosis.  He asked about this.  He is status post cholecystectomy.  Review of outside liver tests are normal.  REVIEW OF SYSTEMS:  All non-GI ROS negative unless otherwise stated in the HPI except for allergies  Past Medical History:  Diagnosis Date  . Allergy   . Anemia    borderline  . Arrhythmia   . BPH (benign prostatic hyperplasia)   . Cancer (HCC)    melanoma- forehead, basal cell- nose, squamous- left of scalp  . Chronic kidney disease    kidney stone  . Diverticulosis   . Hyperlipidemia   . Hypertension      Past Surgical History:  Procedure Laterality Date  . CHOLECYSTECTOMY  06/08/11  . SKIN CANCER EXCISION     x3  . TONSILLECTOMY      Social History Timtohy Stillwagon  reports that he has never smoked. He has never used smokeless tobacco. He reports current alcohol use of about 14.0 standard drinks of alcohol per week. He reports that he does not use drugs.  family history is not on file.  No Known Allergies     PHYSICAL EXAMINATION: Vital signs: BP (!) 148/62   Pulse 60   Temp 98.1 F (36.7 C)   Ht 5\' 5"  (1.651 m)   Wt 166 lb (75.3 kg)   BMI 27.62 kg/m   Constitutional: generally well-appearing, no acute distress Psychiatric: alert and oriented x3, cooperative Eyes: extraocular movements intact, anicteric, conjunctiva pink Mouth: oral pharynx moist, no lesions Neck: supple no lymphadenopathy Cardiovascular: heart regular rate and rhythm, no murmur Lungs: clear to auscultation bilaterally Abdomen: soft, nontender, nondistended, no obvious ascites, no peritoneal signs, normal bowel sounds, no organomegaly Rectal: Deferred until colonoscopy Extremities: no clubbing or cyanosis.  1+ lower extremity edema bilaterally Skin: no lesions on visible extremities Neuro: No focal deficits.  Cranial nerves intact  ASSESSMENT:  1.  Hemoccult positive stool.  Rule out underlying GI mucosal lesion 2.  Minor intermittent rectal bleeding likely due to known hemorrhoids 3.  Extensive diverticulosis.  Previous colonoscopies 2009 and 2015 without neoplasia 4.  Chronic GERD.  Requires PPI. 5.  Intermittent  solid food dysphagia.  Mild and infrequent.  Rule out peptic stricture 6.  Fatty liver on imaging.  Normal liver tests   PLAN:  1.  Reflux precautions 2.  Continue pantoprazole 3.  Schedule upper endoscopy to evaluate chronic GERD, Hemoccult positive stool, and dysphagia.The nature of the procedure, as well as the risks, benefits, and alternatives were carefully and thoroughly  reviewed with the patient. Ample time for discussion and questions allowed. The patient understood, was satisfied, and agreed to proceed. 4.  Schedule colonoscopy to evaluate Hemoccult-positive stool.The nature of the procedure, as well as the risks, benefits, and alternatives were carefully and thoroughly reviewed with the patient. Ample time for discussion and questions allowed. The patient understood, was satisfied, and agreed to proceed. 5.  Discussion on fatty liver A total of 45 minutes was spent preparing to see the patient, reviewing outside tests and x-rays, obtaining and reviewing separately obtained history, performing comprehensive physical examination, counseling the patient regarding the above listed issues and their significance, ordering advanced procedures and discussing their nature in detail, and documenting clinical information in the health record

## 2019-08-01 NOTE — Patient Instructions (Signed)
You have been scheduled for an endoscopy and colonoscopy. Please follow the written instructions given to you at your visit today. Please pick up your prep supplies at the pharmacy within the next 1-3 days. If you use inhalers (even only as needed), please bring them with you on the day of your procedure.  

## 2019-08-07 ENCOUNTER — Ambulatory Visit (INDEPENDENT_AMBULATORY_CARE_PROVIDER_SITE_OTHER): Payer: Medicare Other

## 2019-08-07 DIAGNOSIS — Z1159 Encounter for screening for other viral diseases: Secondary | ICD-10-CM

## 2019-08-08 ENCOUNTER — Telehealth: Payer: Self-pay

## 2019-08-08 DIAGNOSIS — Z01818 Encounter for other preprocedural examination: Secondary | ICD-10-CM

## 2019-08-08 LAB — SARS CORONAVIRUS 2 (TAT 6-24 HRS): SARS Coronavirus 2: NEGATIVE

## 2019-08-08 NOTE — Telephone Encounter (Signed)
Called patient to reschedule procedure and covid test due to weather.

## 2019-08-09 ENCOUNTER — Encounter: Payer: Medicare Other | Admitting: Internal Medicine

## 2019-08-21 ENCOUNTER — Other Ambulatory Visit: Payer: Self-pay

## 2019-08-21 ENCOUNTER — Encounter: Payer: Self-pay | Admitting: Cardiovascular Disease

## 2019-08-21 ENCOUNTER — Ambulatory Visit: Payer: Medicare Other | Admitting: Cardiovascular Disease

## 2019-08-21 VITALS — BP 150/66 | HR 52 | Ht 66.5 in | Wt 164.0 lb

## 2019-08-21 DIAGNOSIS — E782 Mixed hyperlipidemia: Secondary | ICD-10-CM | POA: Diagnosis not present

## 2019-08-21 DIAGNOSIS — I10 Essential (primary) hypertension: Secondary | ICD-10-CM | POA: Diagnosis not present

## 2019-08-21 DIAGNOSIS — R42 Dizziness and giddiness: Secondary | ICD-10-CM | POA: Diagnosis not present

## 2019-08-21 MED ORDER — CHLORTHALIDONE 25 MG PO TABS: 25.0000 mg | ORAL_TABLET | Freq: Every day | ORAL | 3 refills | Status: DC

## 2019-08-21 MED ORDER — POTASSIUM CHLORIDE ER 10 MEQ PO TBCR: 10.0000 meq | EXTENDED_RELEASE_TABLET | Freq: Every day | ORAL | 3 refills | Status: DC

## 2019-08-21 NOTE — Progress Notes (Signed)
Cardiology Office Note:    Date:  08/21/2019   ID:  Patrick Rios, DOB 06/19/1941, MRN [id number]  PCP:  Patrick Battles, MD  Cardiologist:  No primary care provider on file.  Electrophysiologist:  None   Referring MD: Patrick Battles, MD   Chief Complaint  Patient presents with  . Hypertension     History of Present Illness:    Patrick Rios is a 79 y.o. male with a hx of hypertension and bradycardia.  He has had some episodes of dizziness.  Have seen him approximately 10 years ago. Saw Dr. Gwenlyn Rios for some dizziness and bradycardia.  Turned out to be in inner ear issue Resolved after he had some wax cleaned out from his ear  Saw Dr. Stephaney Steven Rios last fall.   Was Rios to have HTN.  Did 24 hr BP monitor . BP has been generally elevated.   Still eats salty foods Patrick Rios every 2 weeks , sausage every 3 days , no hotdogs, no canned foods ,  Has been eating at restaurants 3 times a week ( sandwiches - steak and cheese, [place] , ) No regular exercise  Wife recently diagnosed with alzheimers - he has to stay around the house.   He does all the cooking  waks the dog several times a day - 1/2 mile twice. A day  No CP nor dyspnea  No pnd, orthopnea,  No orthostasis   Has gained weight over this past covid year    Past Medical History:  Diagnosis Date  . Allergy   . Anemia    borderline  . Arrhythmia   . BPH (benign prostatic hyperplasia)   . Cancer (HCC)    melanoma- forehead, basal cell- nose, squamous- left of scalp  . Chronic kidney disease    kidney stone  . Diverticulosis   . Hyperlipidemia   . Hypertension     Past Surgical History:  Procedure Laterality Date  . CHOLECYSTECTOMY  06/08/11  . SKIN CANCER EXCISION     x3  . TONSILLECTOMY      Current Medications: Current Meds  Medication Sig  . B Complex Vitamins (VITAMIN-B COMPLEX PO) Take 1 tablet by mouth daily.   . Calcium Carbonate-Vitamin D (CALTRATE 600+D) 600-400 MG-UNIT per tablet Take 1 tablet by mouth  daily.   . cetirizine (ZYRTEC) 10 MG tablet Take 10 mg by mouth daily.  . fenofibrate (TRICOR) 145 MG tablet Take 145 mg by mouth daily.    Marland Kitchen losartan (COZAAR) 50 MG tablet Take 50 mg by mouth 2 (two) times daily.  . metoprolol (TOPROL-XL) 50 MG 24 hr tablet Take 50 mg by mouth daily.    . Multiple Vitamins-Iron (DAILY MULTIVITAMINS/IRON PO) Take 1 tablet by mouth daily.    . pantoprazole (PROTONIX) 40 MG tablet Take 40 mg by mouth daily.  . Psyllium (METAMUCIL PO) Take by mouth 2 (two) times daily.  . simvastatin (ZOCOR) 20 MG tablet Take 20 mg by mouth daily.   . Vitamin D, Ergocalciferol, (DRISDOL) 1.25 MG (50000 UNIT) CAPS capsule Take 50,000 Units by mouth once a week.     Allergies:   Patient has no known allergies.   Social History   Socioeconomic History  . Marital status: Married    Spouse name: Not on file  . Number of children: 2  . Years of education: Not on file  . Highest education level: Not on file  Occupational History  . Occupation: Retired    Fish farm manager: RETIRED  Tobacco Use  .  Smoking status: Never Smoker  . Smokeless tobacco: Never Used  Substance and Sexual Activity  . Alcohol use: Yes    Alcohol/week: 14.0 standard drinks    Types: 14 Standard drinks or equivalent per week  . Drug use: No  . Sexual activity: Not on file  Other Topics Concern  . Not on file  Social History Narrative  . Not on file   Social Determinants of Health   Financial Resource Strain:   . Difficulty of Paying Living Expenses: Not on file  Food Insecurity:   . Worried About Charity fundraiser in the Last Year: Not on file  . Ran Out of Food in the Last Year: Not on file  Transportation Needs:   . Lack of Transportation (Medical): Not on file  . Lack of Transportation (Non-Medical): Not on file  Physical Activity:   . Days of Exercise per Week: Not on file  . Minutes of Exercise per Session: Not on file  Stress:   . Feeling of Stress : Not on file  Social Connections:     . Frequency of Communication with Friends and Family: Not on file  . Frequency of Social Gatherings with Friends and Family: Not on file  . Attends Religious Services: Not on file  . Active Member of Clubs or Organizations: Not on file  . Attends Archivist Meetings: Not on file  . Marital Status: Not on file     Family History: The patient's family history is negative for Colon cancer, Esophageal cancer, Rectal cancer, and Stomach cancer.  ROS:   Please see the history of present illness.     All other systems reviewed and are negative.  EKGs/Labs/Other Studies Reviewed:    The following studies were reviewed today:   EKG:   August 21, 2019: Sinus bradycardia 52 beats minute.  No ST or T wave changes.  Recent Labs: No results Rios for requested labs within last 8760 hours.  Recent Lipid Panel No results Rios for: CHOL, TRIG, HDL, CHOLHDL, VLDL, LDLCALC, LDLDIRECT  Physical Exam:    VS:  BP (!) 150/66   Pulse (!) 52   Ht 5' 6.5" (1.689 m)   Wt 164 lb (74.4 kg)   SpO2 97%   BMI 26.07 kg/m     Wt Readings from Last 3 Encounters:  08/21/19 164 lb (74.4 kg)  08/01/19 166 lb (75.3 kg)  10/20/17 165 lb 6.4 oz (75 kg)     GEN:  Well nourished, well developed in no acute distress HEENT: Normal NECK: No JVD; No carotid bruits LYMPHATICS: No lymphadenopathy CARDIAC: RRR, no murmurs, rubs, gallops RESPIRATORY:  Clear to auscultation without rales, wheezing or rhonchi  ABDOMEN: Soft, non-tender, non-distended MUSCULOSKELETAL:  No edema; No deformity  SKIN: Warm and dry NEUROLOGIC:  Alert and oriented x 3 PSYCHIATRIC:  Normal affect   ASSESSMENT:    No diagnosis Rios. PLAN:    In order of problems listed above:  1. Essential hypertension:   Patrick Rios is seen back after 10-year absence.  He saw Dr. Alvester Rios several years ago for some dizziness that turned out to be an inner ear issue.  His wife has developed dementia and has been short and largely due to Covid.   He is gained some weight.  He is eating more salt than he should.  We had a long discussion about improving his diet and hopefully his exercise program.  He may need to see if there is a neighbor or church  member who can watch his wife while he goes out for a walk.  I think this will help with weight loss as well as his hypertension.  We talked about reduction in his salt.  We will give him a DASH diet.  I like to start him on chlorthalidone 25 mg a day as well as potassium chloride 10 mEq a day.  We will have him return in approximately 3 weeks for basic metabolic profile and nurse visit blood pressure check.  I encouraged him to walk on a regular basis and to make sure that he is not getting any extra salt.  We will have him see an APP in 3 months.  If his blood pressure remains high then will consider changing the losartan to one of the stronger ARB's ( Valsartan, Telmasartan)    Medication Adjustments/Labs and Tests Ordered: Current medicines are reviewed at length with the patient today.  Concerns regarding medicines are outlined above.  No orders of the defined types were placed in this encounter.  No orders of the defined types were placed in this encounter.   Patient Instructions  Medication Instructions:  Your physician has recommended you make the following change in your medication:  START Chlorthalidone (Hygraton) 25 mg once daily START Kdur (Potassium chloride) 10 mEq once daily  *If you need a refill on your cardiac medications before your next appointment, please call your pharmacy*   Lab Work: Your physician recommends that you return for lab work in: 3 weeks for basic metabolic panel on Friday March 26 at 11:00 am  If you have labs (blood work) drawn today and your tests are completely normal, you will receive your results only by: Marland Kitchen MyChart Message (if you have MyChart) OR . A paper copy in the mail If you have any lab test that is abnormal or we need to change your  treatment, we will call you to review the results.   Testing/Procedures: None Ordered   Follow-Up: Your physician recommends that you return for a follow-up appointment on Friday March 26 at 11:00 am   At [facility], you and your health needs are our priority.  As part of our continuing mission to provide you with exceptional heart care, we have created designated Provider Care Teams.  These Care Teams include your primary Cardiologist (physician) and Advanced Practice Providers (APPs -  Physician Assistants and Nurse Practitioners) who all work together to provide you with the care you need, when you need it.  We recommend signing up for the patient portal called "MyChart".  Sign up information is provided on this After Visit Summary.  MyChart is used to connect with patients for Virtual Visits (Telemedicine).  Patients are able to view lab/test results, encounter notes, upcoming appointments, etc.  Non-urgent messages can be sent to your provider as well.   To learn more about what you can do with MyChart, go to [email or web address].    Your next appointment:   3 month(s)  The format for your next appointment:   In Person  Provider:   Richardson Dopp, PA-C or Robbie Lis, PA-C     DASH Eating Plan DASH stands for "Dietary Approaches to Stop Hypertension." The DASH eating plan is a healthy eating plan that has been shown to reduce high blood pressure (hypertension). It may also reduce your risk for type 2 diabetes, heart disease, and stroke. The DASH eating plan may also help with weight loss. What are tips for following this plan?  General guidelines  Avoid eating more than 2,300 mg (milligrams) of salt (sodium) a day. If you have hypertension, you may need to reduce your sodium intake to 1,500 mg a day.  Limit alcohol intake to no more than 1 drink a day for nonpregnant women and 2 drinks a day for men. One drink equals 12 oz of beer, 5 oz of wine, or 1 oz of hard  liquor.  Work with your health care provider to maintain a healthy body weight or to lose weight. Ask what an ideal weight is for you.  Get at least 30 minutes of exercise that causes your heart to beat faster (aerobic exercise) most days of the week. Activities may include walking, swimming, or biking.  Work with your health care provider or diet and nutrition specialist (dietitian) to adjust your eating plan to your individual calorie needs. Reading food labels   Check food labels for the amount of sodium per serving. Choose foods with less than 5 percent of the Daily Value of sodium. Generally, foods with less than 300 mg of sodium per serving fit into this eating plan.  To find whole grains, look for the word "whole" as the first word in the ingredient list. Shopping  Buy products labeled as "low-sodium" or "no salt added."  Buy fresh foods. Avoid canned foods and premade or frozen meals. Cooking  Avoid adding salt when cooking. Use salt-free seasonings or herbs instead of table salt or sea salt. Check with your health care provider or pharmacist before using salt substitutes.  Do not fry foods. Cook foods using healthy methods such as baking, boiling, grilling, and broiling instead.  Cook with heart-healthy oils, such as olive, canola, soybean, or sunflower oil. Meal planning  Eat a balanced diet that includes: ? 5 or more servings of fruits and vegetables each day. At each meal, try to fill half of your plate with fruits and vegetables. ? Up to 6-8 servings of whole grains each day. ? Less than 6 oz of lean meat, poultry, or fish each day. A 3-oz serving of meat is about the same size as a deck of cards. One egg equals 1 oz. ? 2 servings of low-fat dairy each day. ? A serving of nuts, seeds, or beans 5 times each week. ? Heart-healthy fats. Healthy fats called Omega-3 fatty acids are Rios in foods such as flaxseeds and coldwater fish, like sardines, salmon, and  mackerel.  Limit how much you eat of the following: ? Canned or prepackaged foods. ? Food that is high in trans fat, such as fried foods. ? Food that is high in saturated fat, such as fatty meat. ? Sweets, desserts, sugary drinks, and other foods with added sugar. ? Full-fat dairy products.  Do not salt foods before eating.  Try to eat at least 2 vegetarian meals each week.  Eat more home-cooked food and less restaurant, buffet, and fast food.  When eating at a restaurant, ask that your food be prepared with less salt or no salt, if possible. What foods are recommended? The items listed may not be a complete list. Talk with your dietitian about what dietary choices are best for you. Grains Whole-grain or whole-wheat bread. Whole-grain or whole-wheat pasta. Brown rice. [place]. Bulgur. Whole-grain and low-sodium cereals. Pita bread. Low-fat, low-sodium crackers. Whole-wheat flour tortillas. Vegetables Fresh or frozen vegetables (raw, steamed, roasted, or grilled). Low-sodium or reduced-sodium tomato and vegetable juice. Low-sodium or reduced-sodium tomato sauce and tomato paste. Low-sodium or reduced-sodium canned vegetables. Fruits  All fresh, dried, or frozen fruit. Canned fruit in natural juice (without added sugar). Meat and other protein foods Skinless chicken or [place]. Ground chicken or [place]. Pork with fat trimmed off. Fish and seafood. Egg whites. Dried beans, peas, or lentils. Unsalted nuts, nut butters, and seeds. Unsalted canned beans. Lean cuts of beef with fat trimmed off. Low-sodium, lean deli meat. Dairy Low-fat (1%) or fat-free (skim) milk. Fat-free, low-fat, or reduced-fat cheeses. Nonfat, low-sodium ricotta or cottage cheese. Low-fat or nonfat yogurt. Low-fat, low-sodium cheese. Fats and oils Soft margarine without trans fats. Vegetable oil. Low-fat, reduced-fat, or light mayonnaise and salad dressings (reduced-sodium). Canola, safflower, olive, soybean, and  sunflower oils. Avocado. Seasoning and other foods Herbs. Spices. Seasoning mixes without salt. Unsalted popcorn and pretzels. Fat-free sweets. What foods are not recommended? The items listed may not be a complete list. Talk with your dietitian about what dietary choices are best for you. Grains Baked goods made with fat, such as croissants, muffins, or some breads. Dry pasta or rice meal packs. Vegetables Creamed or fried vegetables. Vegetables in a cheese sauce. Regular canned vegetables (not low-sodium or reduced-sodium). Regular canned tomato sauce and paste (not low-sodium or reduced-sodium). Regular tomato and vegetable juice (not low-sodium or reduced-sodium). Angie Fava. Olives. Fruits Canned fruit in a light or heavy syrup. Fried fruit. Fruit in cream or butter sauce. Meat and other protein foods Fatty cuts of meat. Ribs. Fried meat. Patrick Rios. Sausage. Bologna and other processed lunch meats. Salami. Fatback. Hotdogs. Bratwurst. Salted nuts and seeds. Canned beans with added salt. Canned or smoked fish. Whole eggs or egg yolks. Chicken or [place] with skin. Dairy Whole or 2% milk, cream, and half-and-half. Whole or full-fat cream cheese. Whole-fat or sweetened yogurt. Full-fat cheese. Nondairy creamers. Whipped toppings. Processed cheese and cheese spreads. Fats and oils Butter. Stick margarine. Lard. Shortening. Ghee. Bacon fat. Tropical oils, such as coconut, palm kernel, or palm oil. Seasoning and other foods Salted popcorn and pretzels. Onion salt, garlic salt, seasoned salt, table salt, and sea salt. Worcestershire sauce. Tartar sauce. Barbecue sauce. Teriyaki sauce. Soy sauce, including reduced-sodium. Steak sauce. Canned and packaged gravies. Fish sauce. Oyster sauce. Cocktail sauce. Horseradish that you find on the shelf. Ketchup. Mustard. Meat flavorings and tenderizers. Bouillon cubes. Hot sauce and Tabasco sauce. Premade or packaged marinades. Premade or packaged taco seasonings.  Relishes. Regular salad dressings. Where to find more information:  National Heart, Lung, and [place]: https://wilson-eaton.com/  American Heart Association: www.heart.org Summary  The DASH eating plan is a healthy eating plan that has been shown to reduce high blood pressure (hypertension). It may also reduce your risk for type 2 diabetes, heart disease, and stroke.  With the DASH eating plan, you should limit salt (sodium) intake to 2,300 mg a day. If you have hypertension, you may need to reduce your sodium intake to 1,500 mg a day.  When on the DASH eating plan, aim to eat more fresh fruits and vegetables, whole grains, lean proteins, low-fat dairy, and heart-healthy fats.  Work with your health care provider or diet and nutrition specialist (dietitian) to adjust your eating plan to your individual calorie needs. This information is not intended to replace advice given to you by your health care provider. Make sure you discuss any questions you have with your health care provider. Document Revised: 05/20/2017 Document Reviewed: 05/31/2016 Elsevier Patient Education  2020 Petrey, Mertie Moores, MD  08/21/2019 12:00 PM    [facility]  

## 2019-08-21 NOTE — Patient Instructions (Addendum)
Medication Instructions:  Your physician has recommended you make the following change in your medication:  START Chlorthalidone (Hygraton) 25 mg once daily in the morning START Kdur (Potassium chloride) 10 mEq once daily in the morning  *If you need a refill on your cardiac medications before your next appointment, please call your pharmacy*   Lab Work: Your physician recommends that you return for lab work in: 3 weeks for basic metabolic panel on Friday March 26 at 11:00 am  If you have labs (blood work) drawn today and your tests are completely normal, you will receive your results only by: Marland Kitchen MyChart Message (if you have MyChart) OR . A paper copy in the mail If you have any lab test that is abnormal or we need to change your treatment, we will call you to review the results.   Testing/Procedures: None Ordered   Follow-Up: Your physician recommends that you return for a follow-up appointment on Friday March 26 at 11:00 am   At [facility], you and your health needs are our priority.  As part of our continuing mission to provide you with exceptional heart care, we have created designated Provider Care Teams.  These Care Teams include your primary Cardiologist (physician) and Advanced Practice Providers (APPs -  Physician Assistants and Nurse Practitioners) who all work together to provide you with the care you need, when you need it.  We recommend signing up for the patient portal called "MyChart".  Sign up information is provided on this After Visit Summary.  MyChart is used to connect with patients for Virtual Visits (Telemedicine).  Patients are able to view lab/test results, encounter notes, upcoming appointments, etc.  Non-urgent messages can be sent to your provider as well.   To learn more about what you can do with MyChart, go to [email or web address].    Your next appointment:   3 month(s)   The format for your next appointment:   In Person  Provider:   Richardson Dopp, PA-C or Robbie Lis, PA-C     DASH Eating Plan DASH stands for "Dietary Approaches to Stop Hypertension." The DASH eating plan is a healthy eating plan that has been shown to reduce high blood pressure (hypertension). It may also reduce your risk for type 2 diabetes, heart disease, and stroke. The DASH eating plan may also help with weight loss. What are tips for following this plan?  General guidelines  Avoid eating more than 2,300 mg (milligrams) of salt (sodium) a day. If you have hypertension, you may need to reduce your sodium intake to 1,500 mg a day.  Limit alcohol intake to no more than 1 drink a day for nonpregnant women and 2 drinks a day for men. One drink equals 12 oz of beer, 5 oz of wine, or 1 oz of hard liquor.  Work with your health care provider to maintain a healthy body weight or to lose weight. Ask what an ideal weight is for you.  Get at least 30 minutes of exercise that causes your heart to beat faster (aerobic exercise) most days of the week. Activities may include walking, swimming, or biking.  Work with your health care provider or diet and nutrition specialist (dietitian) to adjust your eating plan to your individual calorie needs. Reading food labels   Check food labels for the amount of sodium per serving. Choose foods with less than 5 percent of the Daily Value of sodium. Generally, foods with less than 300 mg of sodium per serving fit  into this eating plan.  To find whole grains, look for the word "whole" as the first word in the ingredient list. Shopping  Buy products labeled as "low-sodium" or "no salt added."  Buy fresh foods. Avoid canned foods and premade or frozen meals. Cooking  Avoid adding salt when cooking. Use salt-free seasonings or herbs instead of table salt or sea salt. Check with your health care provider or pharmacist before using salt substitutes.  Do not fry foods. Cook foods using healthy methods such as baking, boiling,  grilling, and broiling instead.  Cook with heart-healthy oils, such as olive, canola, soybean, or sunflower oil. Meal planning  Eat a balanced diet that includes: ? 5 or more servings of fruits and vegetables each day. At each meal, try to fill half of your plate with fruits and vegetables. ? Up to 6-8 servings of whole grains each day. ? Less than 6 oz of lean meat, poultry, or fish each day. A 3-oz serving of meat is about the same size as a deck of cards. One egg equals 1 oz. ? 2 servings of low-fat dairy each day. ? A serving of nuts, seeds, or beans 5 times each week. ? Heart-healthy fats. Healthy fats called Omega-3 fatty acids are found in foods such as flaxseeds and coldwater fish, like sardines, salmon, and mackerel.  Limit how much you eat of the following: ? Canned or prepackaged foods. ? Food that is high in trans fat, such as fried foods. ? Food that is high in saturated fat, such as fatty meat. ? Sweets, desserts, sugary drinks, and other foods with added sugar. ? Full-fat dairy products.  Do not salt foods before eating.  Try to eat at least 2 vegetarian meals each week.  Eat more home-cooked food and less restaurant, buffet, and fast food.  When eating at a restaurant, ask that your food be prepared with less salt or no salt, if possible. What foods are recommended? The items listed may not be a complete list. Talk with your dietitian about what dietary choices are best for you. Grains Whole-grain or whole-wheat bread. Whole-grain or whole-wheat pasta. Brown rice. [place]. Bulgur. Whole-grain and low-sodium cereals. Pita bread. Low-fat, low-sodium crackers. Whole-wheat flour tortillas. Vegetables Fresh or frozen vegetables (raw, steamed, roasted, or grilled). Low-sodium or reduced-sodium tomato and vegetable juice. Low-sodium or reduced-sodium tomato sauce and tomato paste. Low-sodium or reduced-sodium canned vegetables. Fruits All fresh, dried, or frozen  fruit. Canned fruit in natural juice (without added sugar). Meat and other protein foods Skinless chicken or [place]. Ground chicken or [place]. Pork with fat trimmed off. Fish and seafood. Egg whites. Dried beans, peas, or lentils. Unsalted nuts, nut butters, and seeds. Unsalted canned beans. Lean cuts of beef with fat trimmed off. Low-sodium, lean deli meat. Dairy Low-fat (1%) or fat-free (skim) milk. Fat-free, low-fat, or reduced-fat cheeses. Nonfat, low-sodium ricotta or cottage cheese. Low-fat or nonfat yogurt. Low-fat, low-sodium cheese. Fats and oils Soft margarine without trans fats. Vegetable oil. Low-fat, reduced-fat, or light mayonnaise and salad dressings (reduced-sodium). Canola, safflower, olive, soybean, and sunflower oils. Avocado. Seasoning and other foods Herbs. Spices. Seasoning mixes without salt. Unsalted popcorn and pretzels. Fat-free sweets. What foods are not recommended? The items listed may not be a complete list. Talk with your dietitian about what dietary choices are best for you. Grains Baked goods made with fat, such as croissants, muffins, or some breads. Dry pasta or rice meal packs. Vegetables Creamed or fried vegetables. Vegetables in a cheese  sauce. Regular canned vegetables (not low-sodium or reduced-sodium). Regular canned tomato sauce and paste (not low-sodium or reduced-sodium). Regular tomato and vegetable juice (not low-sodium or reduced-sodium). Angie Fava. Olives. Fruits Canned fruit in a light or heavy syrup. Fried fruit. Fruit in cream or butter sauce. Meat and other protein foods Fatty cuts of meat. Ribs. Fried meat. [name]. Sausage. Bologna and other processed lunch meats. Salami. Fatback. Hotdogs. Bratwurst. Salted nuts and seeds. Canned beans with added salt. Canned or smoked fish. Whole eggs or egg yolks. Chicken or [place] with skin. Dairy Whole or 2% milk, cream, and half-and-half. Whole or full-fat cream cheese. Whole-fat or sweetened yogurt. Full-fat  cheese. Nondairy creamers. Whipped toppings. Processed cheese and cheese spreads. Fats and oils Butter. Stick margarine. Lard. Shortening. Ghee. Bacon fat. Tropical oils, such as coconut, palm kernel, or palm oil. Seasoning and other foods Salted popcorn and pretzels. Onion salt, garlic salt, seasoned salt, table salt, and sea salt. Worcestershire sauce. Tartar sauce. Barbecue sauce. Teriyaki sauce. Soy sauce, including reduced-sodium. Steak sauce. Canned and packaged gravies. Fish sauce. Oyster sauce. Cocktail sauce. Horseradish that you find on the shelf. Ketchup. Mustard. Meat flavorings and tenderizers. Bouillon cubes. Hot sauce and Tabasco sauce. Premade or packaged marinades. Premade or packaged taco seasonings. Relishes. Regular salad dressings. Where to find more information:  National Heart, Lung, and [place]: https://wilson-eaton.com/  American Heart Association: www.heart.org Summary  The DASH eating plan is a healthy eating plan that has been shown to reduce high blood pressure (hypertension). It may also reduce your risk for type 2 diabetes, heart disease, and stroke.  With the DASH eating plan, you should limit salt (sodium) intake to 2,300 mg a day. If you have hypertension, you may need to reduce your sodium intake to 1,500 mg a day.  When on the DASH eating plan, aim to eat more fresh fruits and vegetables, whole grains, lean proteins, low-fat dairy, and heart-healthy fats.  Work with your health care provider or diet and nutrition specialist (dietitian) to adjust your eating plan to your individual calorie needs. This information is not intended to replace advice given to you by your health care provider. Make sure you discuss any questions you have with your health care provider. Document Revised: 05/20/2017 Document Reviewed: 05/31/2016 Elsevier Patient Education  2020 [employer].

## 2019-09-04 ENCOUNTER — Ambulatory Visit (INDEPENDENT_AMBULATORY_CARE_PROVIDER_SITE_OTHER): Payer: Medicare Other

## 2019-09-04 ENCOUNTER — Other Ambulatory Visit: Payer: Self-pay | Admitting: Internal Medicine

## 2019-09-04 DIAGNOSIS — Z1159 Encounter for screening for other viral diseases: Secondary | ICD-10-CM

## 2019-09-05 LAB — SARS CORONAVIRUS 2 (TAT 6-24 HRS): SARS Coronavirus 2: NEGATIVE

## 2019-09-06 ENCOUNTER — Encounter: Payer: Self-pay | Admitting: Internal Medicine

## 2019-09-06 ENCOUNTER — Ambulatory Visit (AMBULATORY_SURGERY_CENTER): Payer: Medicare Other | Admitting: Internal Medicine

## 2019-09-06 ENCOUNTER — Other Ambulatory Visit: Payer: Self-pay

## 2019-09-06 VITALS — BP 118/42 | HR 80 | Temp 97.1°F | Resp 22 | Ht 65.0 in | Wt 166.0 lb

## 2019-09-06 DIAGNOSIS — R1310 Dysphagia, unspecified: Secondary | ICD-10-CM | POA: Diagnosis not present

## 2019-09-06 DIAGNOSIS — Z86010 Personal history of colonic polyps: Secondary | ICD-10-CM

## 2019-09-06 DIAGNOSIS — R195 Other fecal abnormalities: Secondary | ICD-10-CM | POA: Diagnosis not present

## 2019-09-06 DIAGNOSIS — K5730 Diverticulosis of large intestine without perforation or abscess without bleeding: Secondary | ICD-10-CM

## 2019-09-06 DIAGNOSIS — K449 Diaphragmatic hernia without obstruction or gangrene: Secondary | ICD-10-CM | POA: Diagnosis not present

## 2019-09-06 DIAGNOSIS — K222 Esophageal obstruction: Secondary | ICD-10-CM

## 2019-09-06 DIAGNOSIS — R1319 Other dysphagia: Secondary | ICD-10-CM

## 2019-09-06 DIAGNOSIS — K219 Gastro-esophageal reflux disease without esophagitis: Secondary | ICD-10-CM

## 2019-09-06 HISTORY — PX: COLONOSCOPY: SHX174

## 2019-09-06 HISTORY — PX: UPPER GASTROINTESTINAL ENDOSCOPY: SHX188

## 2019-09-06 MED ORDER — SODIUM CHLORIDE 0.9 % IV SOLN: 500.0000 mL | Freq: Once | INTRAVENOUS | Status: AC

## 2019-09-06 NOTE — Patient Instructions (Signed)
Handouts provided on post dilation diet, esophageal stricture, and diverticulosis.   YOU HAD AN ENDOSCOPIC PROCEDURE TODAY AT [facility]:   Refer to the procedure report that was given to you for any specific questions about what was found during the examination.  If the procedure report does not answer your questions, please call your gastroenterologist to clarify.  If you requested that your care partner not be given the details of your procedure findings, then the procedure report has been included in a sealed envelope for you to review at your convenience later.  YOU SHOULD EXPECT: Some feelings of bloating in the abdomen. Passage of more gas than usual.  Walking can help get rid of the air that was put into your GI tract during the procedure and reduce the bloating. If you had a lower endoscopy (such as a colonoscopy or flexible sigmoidoscopy) you may notice spotting of blood in your stool or on the toilet paper. If you underwent a bowel prep for your procedure, you may not have a normal bowel movement for a few days.  Please Note:  You might notice some irritation and congestion in your nose or some drainage.  This is from the oxygen used during your procedure.  There is no need for concern and it should clear up in a day or so.  SYMPTOMS TO REPORT IMMEDIATELY:   Following lower endoscopy (colonoscopy or flexible sigmoidoscopy):  Excessive amounts of blood in the stool  Significant tenderness or worsening of abdominal pains  Swelling of the abdomen that is new, acute  Fever of 100F or higher   Following upper endoscopy (EGD)  Vomiting of blood or coffee ground material  New chest pain or pain under the shoulder blades  Painful or persistently difficult swallowing  New shortness of breath  Fever of 100F or higher  Black, tarry-looking stools  For urgent or emergent issues, a gastroenterologist can be reached at any hour by calling [phone]. Do not use MyChart  messaging for urgent concerns.    DIET: Clear liquids for 2 hours (5pm), then a soft diet for the rest of today (see handout).  Drink plenty of fluids but you should avoid alcoholic beverages for 24 hours.  ACTIVITY:  You should plan to take it easy for the rest of today and you should NOT DRIVE or use heavy machinery until tomorrow (because of the sedation medicines used during the test).    FOLLOW UP: Our staff will call the number listed on your records 48-72 hours following your procedure to check on you and address any questions or concerns that you may have regarding the information given to you following your procedure. If we do not reach you, we will leave a message.  We will attempt to reach you two times.  During this call, we will ask if you have developed any symptoms of COVID 19. If you develop any symptoms (ie: fever, flu-like symptoms, shortness of breath, cough etc.) before then, please call [phone].  If you test positive for Covid 19 in the 2 weeks post procedure, please call and report this information to [place].    If any biopsies were taken you will be contacted by phone or by letter within the next 1-3 weeks.  Please call us at [phone] if you have not heard about the biopsies in 3 weeks.    SIGNATURES/CONFIDENTIALITY: You and/or your care partner have signed paperwork which will be entered into your electronic medical record.  These  signatures attest to the fact that that the information above on your After Visit Summary has been reviewed and is understood.  Full responsibility of the confidentiality of this discharge information lies with you and/or your care-partner.

## 2019-09-06 NOTE — Op Note (Signed)
[facility] Patient Name: Nagee Prey Procedure Date: 09/06/2019 2:21 PM MRN: [id number] Endoscopist: Docia Chuck. Henrene Pastor , MD Age: 79 Referring MD:  Date of Birth: 03-13-1941 Gender: Male Account #: [id number] Procedure:                Colonoscopy Indications:              Heme positive stool. Multiple previous                            examinations. Last examination 2015 with                            diverticulosis. Medicines:                Monitored Anesthesia Care Procedure:                Pre-Anesthesia Assessment:                           - Prior to the procedure, a History and Physical                            was performed, and patient medications and                            allergies were reviewed. The patient's tolerance of                            previous anesthesia was also reviewed. The risks                            and benefits of the procedure and the sedation                            options and risks were discussed with the patient.                            All questions were answered, and informed consent                            was obtained. Prior Anticoagulants: The patient has                            taken no previous anticoagulant or antiplatelet                            agents. ASA Grade Assessment: II - A patient with                            mild systemic disease. After reviewing the risks                            and benefits, the patient was deemed in  satisfactory condition to undergo the procedure.                           After obtaining informed consent, the colonoscope                            was passed under direct vision. Throughout the                            procedure, the patient's blood pressure, pulse, and                            oxygen saturations were monitored continuously. The                            Colonoscope was introduced through the anus and           advanced to the the cecum, identified by                            appendiceal orifice and ileocecal valve. The                            ileocecal valve, appendiceal orifice, and rectum                            were photographed. The quality of the bowel                            preparation was excellent. The colonoscopy was                            performed without difficulty. The patient tolerated                            the procedure well. The bowel preparation used was                            SUPREP via split dose instruction. Scope In: 2:32:39 PM Scope Out: 2:44:51 PM Scope Withdrawal Time: 0 hours 9 minutes 19 seconds  Total Procedure Duration: 0 hours 12 minutes 12 seconds  Findings:                 Many small and large-mouthed diverticula were found                            in the entire colon.                           The exam was otherwise without abnormality on                            direct and retroflexion views. Complications:            No immediate complications. Estimated blood loss:  None. Estimated Blood Loss:     Estimated blood loss: none. Impression:               - Diverticulosis in the entire examined colon.                           - The examination was otherwise normal on direct                            and retroflexion views.                           - No specimens collected. Recommendation:           - Repeat colonoscopy is not recommended due to                            current age and favorable findings, for                            surveillance.                           - Patient has a contact number available for                            emergencies. The signs and symptoms of potential                            delayed complications were discussed with the                            patient. Return to normal activities tomorrow.                            Written discharge instructions  were provided to the                            patient.                           - Resume previous diet.                           - Continue present medications.                           - EGD today. See report Docia Chuck. Henrene Pastor, MD 09/06/2019 2:54:52 PM This report has been signed electronically.

## 2019-09-06 NOTE — Progress Notes (Signed)
Temp check by:JB Vital check by:DT  The medical and surgical history was reviewed and verified with the patient. 

## 2019-09-06 NOTE — Op Note (Signed)
Patrick Rios: Patrick Rios Procedure Date: 09/06/2019 2:46 PM MRN: [id number] Endoscopist: Docia Chuck. Henrene Pastor , MD Age: 79 Referring MD:  Date of Birth: 08-04-1940 Gender: Male Account #: [id number] Procedure:                Upper GI endoscopy with [facility] dilation of the                            esophagus. 69 French Indications:              Dysphagia Medicines:                Monitored Anesthesia Care Procedure:                Pre-Anesthesia Assessment:                           - Prior to the procedure, a History and Physical                            was performed, and patient medications and                            allergies were reviewed. The patient's tolerance of                            previous anesthesia was also reviewed. The risks                            and benefits of the procedure and the sedation                            options and risks were discussed with the patient.                            All questions were answered, and informed consent                            was obtained. Prior Anticoagulants: The patient has                            taken no previous anticoagulant or antiplatelet                            agents. ASA Grade Assessment: II - A patient with                            mild systemic disease. After reviewing the risks                            and benefits, the patient was deemed in                            satisfactory condition to undergo the procedure.  After obtaining informed consent, the endoscope was                            passed under direct vision. Throughout the                            procedure, the patient's blood pressure, pulse, and                            oxygen saturations were monitored continuously. The                            Endoscope was introduced through the mouth, and                            advanced to the second part of duodenum. The upper                             GI endoscopy was accomplished without difficulty.                            The patient tolerated the procedure well. Scope In: Scope Out: Findings:                 One benign-appearing, intrinsic moderate stenosis                            was found 40 cm from the incisors. This stenosis                            measured 1.5 cm (inner diameter). The scope was                            withdrawn after completing the endoscopic survey.                            Dilation was then performed with a Maloney dilator                            with no resistance at 58 Fr.                           The exam of the esophagus was otherwise normal.                           The stomach was normal. Small hiatal hernia present.                           The examined duodenum was normal.                           The cardia and gastric fundus were normal on  retroflexion. Complications:            No immediate complications. Estimated Blood Loss:     Estimated blood loss: none. Impression:               - Benign-appearing esophageal stenosis. Dilated.                           - Normal stomach.                           - Normal examined duodenum.                           - No specimens collected. Recommendation:           - Patient has a contact number available for                            emergencies. The signs and symptoms of potential                            delayed complications were discussed with the                            patient. Return to normal activities tomorrow.                            Written discharge instructions were provided to the                            patient.                           - Post dilation diet.                           - Continue present medications.                           - Return to the care of Dr. Philip Aspen. Do NOT                            recommend routine Hemasure testing as part  of your                            routine annual evaluation Aronda Burford N. Henrene Pastor, MD 09/06/2019 3:06:53 PM This report has been signed electronically.

## 2019-09-06 NOTE — Progress Notes (Signed)
PT taken to PACU. Monitors in place. VSS. Report given to RN. 

## 2019-09-10 ENCOUNTER — Telehealth: Payer: Self-pay | Admitting: *Deleted

## 2019-09-10 NOTE — Telephone Encounter (Signed)
No answer for post procedure call back. Left message for patient to call with questions or concerns. 

## 2019-09-10 NOTE — Telephone Encounter (Signed)
Follow up call made, no answer, left message 

## 2019-09-14 ENCOUNTER — Other Ambulatory Visit: Payer: Medicare Other | Admitting: *Deleted

## 2019-09-14 ENCOUNTER — Ambulatory Visit (INDEPENDENT_AMBULATORY_CARE_PROVIDER_SITE_OTHER): Payer: Medicare Other | Admitting: Nurse Practitioner

## 2019-09-14 ENCOUNTER — Other Ambulatory Visit: Payer: Self-pay

## 2019-09-14 VITALS — BP 118/50 | HR 56 | Ht 66.5 in | Wt 156.8 lb

## 2019-09-14 DIAGNOSIS — I10 Essential (primary) hypertension: Secondary | ICD-10-CM

## 2019-09-14 LAB — BASIC METABOLIC PANEL
BUN/Creatinine Ratio: 26 — ABNORMAL HIGH (ref 10–24)
BUN: 25 mg/dL (ref 8–27)
CO2: 24 mmol/L (ref 20–29)
Calcium: 9.7 mg/dL (ref 8.6–10.2)
Chloride: 103 mmol/L (ref 96–106)
Creatinine, Ser: 0.97 mg/dL (ref 0.76–1.27)
GFR calc Af Amer: 87 mL/min/{1.73_m2} (ref >59)
GFR calc non Af Amer: 75 mL/min/{1.73_m2} (ref >59)
Glucose: 126 mg/dL — ABNORMAL HIGH (ref 65–99)
Potassium: 4.1 mmol/L (ref 3.5–5.2)
Sodium: 140 mmol/L (ref 134–144)

## 2019-09-14 MED ORDER — LOSARTAN POTASSIUM 50 MG PO TABS: 50.0000 mg | ORAL_TABLET | Freq: Every day | ORAL | 3 refills | Status: DC

## 2019-09-14 NOTE — Patient Instructions (Signed)
Medication Instructions:  Your physician has recommended you make the following change in your medication:  DECREASE Losartan to 50 mg once daily  *If you need a refill on your cardiac medications before your next appointment, please call your pharmacy*   Lab Work: TODAY - basic metabolic panel If you have labs (blood work) drawn today and your tests are completely normal, you will receive your results only by: Marland Kitchen MyChart Message (if you have MyChart) OR . A paper copy in the mail If you have any lab test that is abnormal or we need to change your treatment, we will call you to review the results.   Testing/Procedures: None Ordered   Follow-Up: At [facility], you and your health needs are our priority.  As part of our continuing mission to provide you with exceptional heart care, we have created designated Provider Care Teams.  These Care Teams include your primary Cardiologist (physician) and Advanced Practice Providers (APPs -  Physician Assistants and Nurse Practitioners) who all work together to provide you with the care you need, when you need it.  We recommend signing up for the patient portal called "MyChart".  Sign up information is provided on this After Visit Summary.  MyChart is used to connect with patients for Virtual Visits (Telemedicine).  Patients are able to view lab/test results, encounter notes, upcoming appointments, etc.  Non-urgent messages can be sent to your provider as well.   To learn more about what you can do with MyChart, go to [email or web address].    Your next appointment:   3 month(s) on June 4  The format for your next appointment:   In Person  Provider:   Robbie Lis, PA-C   **If you are feeling well you may call back to change your appointment to September.

## 2019-09-14 NOTE — Progress Notes (Signed)
1.) Reason for visit: BP check  2.) Name of MD requesting visit: Dr. Acie Fredrickson  3.) H&P: Patient presents for BP check due to medication changes made on 3/2. Patient was started on chlorthalidone 25 mg daily and Kdur 10 mEq daily due to uncontrolled HTN.   4.) ROS related to problem: Pt denies complaints. States he has felt well since starting the new medications and he has no concerns. He is in NAD.  5.) Assessment and plan per MD: Additional BP in Rt arm also soft at 100/56. Per Dr. Acie Fredrickson will reduce losartan to 50 mg once daily. Advised patient to keep f/u in June if BP is not consistently <130/80 but if BP is well-controlled he may reschedule appointment for 6 months. Any additional changes needed based on BMET results will be called to patient.

## 2019-11-23 ENCOUNTER — Ambulatory Visit: Payer: Medicare Other | Admitting: Physician Assistant

## 2019-11-23 ENCOUNTER — Encounter: Payer: Self-pay | Admitting: Physician Assistant

## 2019-11-23 ENCOUNTER — Other Ambulatory Visit: Payer: Self-pay

## 2019-11-23 VITALS — BP 118/64 | HR 57 | Ht 66.5 in | Wt 160.1 lb

## 2019-11-23 DIAGNOSIS — R42 Dizziness and giddiness: Secondary | ICD-10-CM | POA: Diagnosis not present

## 2019-11-23 DIAGNOSIS — R001 Bradycardia, unspecified: Secondary | ICD-10-CM

## 2019-11-23 DIAGNOSIS — I493 Ventricular premature depolarization: Secondary | ICD-10-CM

## 2019-11-23 DIAGNOSIS — E782 Mixed hyperlipidemia: Secondary | ICD-10-CM | POA: Diagnosis not present

## 2019-11-23 DIAGNOSIS — I10 Essential (primary) hypertension: Secondary | ICD-10-CM | POA: Diagnosis not present

## 2019-11-23 NOTE — Patient Instructions (Addendum)
Medication Instructions:  Your physician recommends that you continue on your current medications as directed. Please refer to the Current Medication list given to you today.  *If you need a refill on your cardiac medications before your next appointment, please call your pharmacy*   Lab Work: None ordered  If you have labs (blood work) drawn today and your tests are completely normal, you will receive your results only by: Marland Kitchen MyChart Message (if you have MyChart) OR . A paper copy in the mail If you have any lab test that is abnormal or we need to change your treatment, we will call you to review the results.   Testing/Procedures: None ordered   Follow-Up: At [facility], you and your health needs are our priority.  As part of our continuing mission to provide you with exceptional heart care, we have created designated Provider Care Teams.  These Care Teams include your primary Cardiologist (physician) and Advanced Practice Providers (APPs -  Physician Assistants and Nurse Practitioners) who all work together to provide you with the care you need, when you need it.  We recommend signing up for the patient portal called "MyChart".  Sign up information is provided on this After Visit Summary.  MyChart is used to connect with patients for Virtual Visits (Telemedicine).  Patients are able to view lab/test results, encounter notes, upcoming appointments, etc.  Non-urgent messages can be sent to your provider as well.   To learn more about what you can do with MyChart, go to [email or web address].    Your next appointment:   12 month(s)  The format for your next appointment:   In Person  Provider:   You may see Dr. Acie Fredrickson or one of the following Advanced Practice Providers on your designated Care Team:    Richardson Dopp, PA-C  Robbie Lis, [place]    Other Instructions

## 2019-11-23 NOTE — Progress Notes (Signed)
Cardiology Office Note    Date:  11/23/2019   ID:  Patrick Rios, DOB 28-Jul-1940, MRN [id number]  PCP:  Leanna Battles, MD  Cardiologist:  Dr. Acie Fredrickson   Chief Complaint: 3  Months follow up  History of Present Illness:   Patrick Rios is a 79 y.o. male male with history of hypertension, hyperlipidemia and prior dizziness secondary to inner ear issue (resolved with removal of wax) seen for follow-up.  Echo 03/2009 showed normal LVEF.   Last seen by Dr. Acie Fredrickson 08/2019. Blood pressure was elevated. Recommended DASH diet and started on chlorthalidone with potassium supplement.  Here today for follow-up.  No regular exercise but takes care of wife who has dementia.  Does household work.  Recently had endoscopy and underwent Melanie dilatation of esophagus.  He also has abdominal hernia.  He reports his dizziness during eating and dysphagia has improved after EGD.  Denies chest pain, shortness of breath, orthopnea, PND, syncope, lower extremity edema or melena.   Past Medical History:  Diagnosis Date  . Allergy   . Anemia    borderline  . Arrhythmia   . BPH (benign prostatic hyperplasia)   . Cancer (HCC)    melanoma- forehead, basal cell- nose, squamous- left of scalp  . Chronic kidney disease    kidney stones  . Diverticulosis   . Hyperlipidemia   . Hypertension     Past Surgical History:  Procedure Laterality Date  . CHOLECYSTECTOMY  06/08/11  . COLONOSCOPY  09/06/2019  . SKIN CANCER EXCISION     x3  . TONSILLECTOMY    . UPPER GASTROINTESTINAL ENDOSCOPY  09/06/2019    Current Medications: Prior to Admission medications   Medication Sig Start Date End Date Taking? Authorizing Provider  B Complex Vitamins (VITAMIN-B COMPLEX PO) Take 1 tablet by mouth daily.     [provider]  Calcium Carbonate-Vitamin D (CALTRATE 600+D) 600-400 MG-UNIT per tablet Take 1 tablet by mouth daily.     [provider]  cetirizine (ZYRTEC) 10 MG tablet Take 10 mg by mouth  daily.    [provider]  chlorthalidone (HYGROTON) 25 MG tablet Take 1 tablet (25 mg total) by mouth daily. 08/21/19   Nahser, Wonda Cheng, MD  fenofibrate (TRICOR) 145 MG tablet Take 145 mg by mouth daily.      [provider]  losartan (COZAAR) 50 MG tablet Take 1 tablet (50 mg total) by mouth daily. 09/14/19   Nahser, Wonda Cheng, MD  metoprolol (TOPROL-XL) 50 MG 24 hr tablet Take 50 mg by mouth daily.      [provider]  Multiple Vitamins-Iron (DAILY MULTIVITAMINS/IRON PO) Take 1 tablet by mouth daily.      [provider]  pantoprazole (PROTONIX) 40 MG tablet Take 40 mg by mouth daily.    [provider]  potassium chloride (KLOR-CON) 10 MEQ tablet Take 1 tablet (10 mEq total) by mouth daily. 08/21/19   Nahser, Wonda Cheng, MD  Psyllium (METAMUCIL PO) Take by mouth 2 (two) times daily.    [provider]  simvastatin (ZOCOR) 20 MG tablet Take 20 mg by mouth daily.  03/13/11   [provider]  Vitamin D, Ergocalciferol, (DRISDOL) 1.25 MG (50000 UNIT) CAPS capsule Take 50,000 Units by mouth once a week. 07/28/19   [provider]    Allergies:   Patient has no known allergies.   Social History   Socioeconomic History  . Marital status: Married    Spouse name: Not on  file  . Number of children: 2  . Years of education: Not on file  . Highest education level: Not on file  Occupational History  . Occupation: Retired    Fish farm manager: RETIRED  Tobacco Use  . Smoking status: Never Smoker  . Smokeless tobacco: Never Used  Substance and Sexual Activity  . Alcohol use: Not Currently    Comment: hasnt drank since 2016  . Drug use: No  . Sexual activity: Not on file  Other Topics Concern  . Not on file  Social History Narrative  . Not on file   Social Determinants of Health   Financial Resource Strain:   . Difficulty of Paying Living Expenses:   Food Insecurity:   . Worried About Charity fundraiser in the Last Year:   . Arts development officer in the Last Year:   Transportation Needs:   . Film/video editor (Medical):   Marland Kitchen Lack of Transportation (Non-Medical):   Physical Activity:   . Days of Exercise per Week:   . Minutes of Exercise per Session:   Stress:   . Feeling of Stress :   Social Connections:   . Frequency of Communication with Friends and Family:   . Frequency of Social Gatherings with Friends and Family:   . Attends Religious Services:   . Active Member of Clubs or Organizations:   . Attends Archivist Meetings:   Marland Kitchen Marital Status:      Family History:  The patient's family history includes Bladder Cancer in his brother.   ROS:   Please see the history of present illness.    ROS All other systems reviewed and are negative.   PHYSICAL EXAM:   VS:  BP 118/64   Pulse (!) 57   Ht 5' 6.5" (1.689 m)   Wt 160 lb 1.9 oz (72.6 kg)   SpO2 97%   BMI 25.46 kg/m    GEN: Well nourished, well developed, in no acute distress  HEENT: normal  Neck: no JVD, carotid bruits, or masses Cardiac: RRR; no murmurs, rubs, or gallops,no edema  Respiratory:  clear to auscultation bilaterally, normal work of breathing GI: soft, nontender, nondistended, + BS, abdominal hernia noted MS: no deformity or atrophy  Skin: warm and dry, no rash Neuro:  Alert and Oriented x 3, Strength and sensation are intact Psych: euthymic mood, full affect  Wt Readings from Last 3 Encounters:  11/23/19 160 lb 1.9 oz (72.6 kg)  09/14/19 156 lb 12 oz (71.1 kg)  09/06/19 166 lb (75.3 kg)      Studies/Labs Reviewed:   EKG:  EKG is ordered today.  The ekg ordered today demonstrates sinus bradycardia at rate of 50 bpm  Recent Labs: 09/14/2019: BUN 25; Creatinine, Ser 0.97; Potassium 4.1; Sodium 140   Lipid Panel No results found for: CHOL, TRIG, HDL, CHOLHDL, VLDL, LDLCALC, LDLDIRECT  Additional studies/ records that were reviewed today include:   As above    ASSESSMENT & PLAN:    1. Hypertension Blood  pressure stable and well-controlled on current medication  2.  PVC -Noted extra beat on exam.  EKG showed normal sinus rhythm at rate of 50 bpm.  Rhythm strip on personal review showed rare PVC.  Asymptomatic.  3.  Dizziness -His symptoms occurs during eating.  Recently underwent melena dilatation of esophagus.  He also has abdominal hernia which is noted with exam.  He has intermittent abdominal pain with bending.  He will follow-up with PCP.  4. Sinus bradycardia - I dont think his dizziness is due to bradycardia. Advised to monitor symptoms.   Medication Adjustments/Labs and Tests Ordered: Current medicines are reviewed at length with the patient today.  Concerns regarding medicines are outlined above.  Medication changes, Labs and Tests ordered today are listed in the Patient Instructions below. Patient Instructions  Medication Instructions:  Your physician recommends that you continue on your current medications as directed. Please refer to the Current Medication list given to you today.  *If you need a refill on your cardiac medications before your next appointment, please call your pharmacy*   Lab Work: None ordered  If you have labs (blood work) drawn today and your tests are completely normal, you will receive your results only by: Marland Kitchen MyChart Message (if you have MyChart) OR . A paper copy in the mail If you have any lab test that is abnormal or we need to change your treatment, we will call you to review the results.   Testing/Procedures: None ordered   Follow-Up: At [facility], you and your health needs are our priority.  As part of our continuing mission to provide you with exceptional heart care, we have created designated Provider Care Teams.  These Care Teams include your primary Cardiologist (physician) and Advanced Practice Providers (APPs -  Physician Assistants and Nurse Practitioners) who all work together to provide you with the care you need, when you need  it.  We recommend signing up for the patient portal called "MyChart".  Sign up information is provided on this After Visit Summary.  MyChart is used to connect with patients for Virtual Visits (Telemedicine).  Patients are able to view lab/test results, encounter notes, upcoming appointments, etc.  Non-urgent messages can be sent to your provider as well.   To learn more about what you can do with MyChart, go to [email or web address].    Your next appointment:   12 month(s)  The format for your next appointment:   In Person  Provider:   You may see Dr. Acie Fredrickson or one of the following Advanced Practice Providers on your designated Care Team:    Richardson Dopp, PA-C  Robbie Lis, PA-C    Other Instructions      Signed, Leanor Kail, [place]  11/23/2019 3:10 PM    [address], [place], [place]  [address] Phone: [phone]; Fax: [phone]

## 2020-08-11 ENCOUNTER — Other Ambulatory Visit: Payer: Self-pay | Admitting: Cardiovascular Disease

## 2020-09-08 ENCOUNTER — Other Ambulatory Visit: Payer: Self-pay | Admitting: Cardiovascular Disease

## 2020-11-04 ENCOUNTER — Other Ambulatory Visit: Payer: Self-pay | Admitting: Cardiovascular Disease

## 2020-12-01 ENCOUNTER — Other Ambulatory Visit: Payer: Self-pay | Admitting: Cardiovascular Disease

## 2020-12-16 ENCOUNTER — Other Ambulatory Visit: Payer: Self-pay | Admitting: Cardiovascular Disease

## 2021-02-08 ENCOUNTER — Other Ambulatory Visit: Payer: Self-pay | Admitting: Cardiovascular Disease

## 2021-02-15 ENCOUNTER — Other Ambulatory Visit: Payer: Self-pay | Admitting: Cardiovascular Disease

## 2021-02-16 ENCOUNTER — Ambulatory Visit: Payer: Medicare Other | Admitting: Cardiovascular Disease

## 2021-02-16 ENCOUNTER — Encounter: Payer: Self-pay | Admitting: Cardiovascular Disease

## 2021-02-16 ENCOUNTER — Other Ambulatory Visit: Payer: Self-pay

## 2021-02-16 VITALS — BP 118/66 | HR 62 | Ht 66.5 in | Wt 156.4 lb

## 2021-02-16 DIAGNOSIS — I10 Essential (primary) hypertension: Secondary | ICD-10-CM

## 2021-02-16 DIAGNOSIS — E782 Mixed hyperlipidemia: Secondary | ICD-10-CM | POA: Diagnosis not present

## 2021-02-16 DIAGNOSIS — I493 Ventricular premature depolarization: Secondary | ICD-10-CM | POA: Diagnosis not present

## 2021-02-16 NOTE — Progress Notes (Signed)
Cardiology Office Note:    Date:  02/16/2021   ID:  Patrick Rios, DOB 1941/01/28, MRN [id number]  PCP:  Leanna Battles, MD  Cardiologist:  Mertie Moores, MD  Electrophysiologist:  None   Referring MD: Leanna Battles, MD   No chief complaint on file.    History of Present Illness:    Patrick Rios is a 80 y.o. male with a hx of hypertension and bradycardia.  He has had some episodes of dizziness.  Have seen him approximately 10 years ago. Saw Dr. [name] for some dizziness and bradycardia.  Turned out to be in inner ear issue Resolved after he had some wax cleaned out from his ear  Saw Dr. Amandeep Nesmith [place] last fall.   Was found to have HTN.  Did 24 hr BP monitor . BP has been generally elevated.   Still eats salty foods [name] every 2 weeks , sausage every 3 days , no hotdogs, no canned foods ,  Has been eating at restaurants 3 times a week ( sandwiches - steak and cheese, [place] , ) No regular exercise  Wife recently diagnosed with alzheimers - he has to stay around the house.   He does all the cooking  waks the dog several times a day - 1/2 mile twice. A day  No CP nor dyspnea  No pnd, orthopnea,  No orthostasis   Has gained weight over this past covid year   Aug. 29, 2022: [name] is seen for follow up of his HTN,  He had an episdose of syncope a year ago  Has occasional presyncope / lightheadedness. - seemed to be related to food getting stuck in his esophagus.  Has difficulty with endurance He has been getting more and more fatigued with daily activites  Cares for his wife with alzheimers so his time for exercise is very limited.       Past Medical History:  Diagnosis Date   Allergy    Anemia    borderline   Arrhythmia    BPH (benign prostatic hyperplasia)    Cancer (HCC)    melanoma- forehead, basal cell- nose, squamous- left of scalp   Chronic kidney disease    kidney stones   Diverticulosis    Hyperlipidemia    Hypertension     Past Surgical History:   Procedure Laterality Date   CHOLECYSTECTOMY  06/08/11   COLONOSCOPY  09/06/2019   SKIN CANCER EXCISION     x3   TONSILLECTOMY     UPPER GASTROINTESTINAL ENDOSCOPY  09/06/2019    Current Medications: Current Meds  Medication Sig   Calcium Carbonate-Vitamin D 600-400 MG-UNIT tablet Take 1 tablet by mouth daily.    cetirizine (ZYRTEC) 10 MG tablet Take 10 mg by mouth daily.   chlorthalidone (HYGROTON) 25 MG tablet Take 1 tablet (25 mg total) by mouth daily. Please keep upcoming appointment for further refills   fenofibrate (TRICOR) 145 MG tablet Take 145 mg by mouth daily.     losartan (COZAAR) 50 MG tablet Take 1 tablet (50 mg total) by mouth daily. Please keep upcoming appt in August 2022 with Dr. Acie Fredrickson before anymore refills. Thank you   metoprolol (TOPROL-XL) 50 MG 24 hr tablet Take 50 mg by mouth daily.     Multiple Vitamins-Iron (DAILY MULTIVITAMINS/IRON PO) Take 1 tablet by mouth daily.     pantoprazole (PROTONIX) 40 MG tablet Take 40 mg by mouth daily.   potassium chloride (KLOR-CON) 10 MEQ tablet TAKE 1 TABLET ONCE DAILY.   Psyllium (METAMUCIL  PO) Take by mouth 2 (two) times daily.   simvastatin (ZOCOR) 20 MG tablet Take 20 mg by mouth daily.    Vitamin D, Ergocalciferol, (DRISDOL) 1.25 MG (50000 UNIT) CAPS capsule Take 50,000 Units by mouth once a week.   Current Facility-Administered Medications for the 02/16/21 encounter (Office Visit) with Coe Angelos, Wonda Cheng, MD  Medication   0.9 %  sodium chloride infusion     Allergies:   Patient has no known allergies.   Social History   Socioeconomic History   Marital status: Married    Spouse name: Not on file   Number of children: 2   Years of education: Not on file   Highest education level: Not on file  Occupational History   Occupation: Retired    Fish farm manager: RETIRED  Tobacco Use   Smoking status: Never   Smokeless tobacco: Never  Vaping Use   Vaping Use: Never used  Substance and Sexual Activity   Alcohol use: Not  Currently    Comment: hasnt drank since 2016   Drug use: No   Sexual activity: Not on file  Other Topics Concern   Not on file  Social History Narrative   Not on file   Social Determinants of Health   Financial Resource Strain: Not on file  Food Insecurity: Not on file  Transportation Needs: Not on file  Physical Activity: Not on file  Stress: Not on file  Social Connections: Not on file     Family History: The patient's family history includes Bladder Cancer in his brother. There is no history of Colon cancer, Esophageal cancer, Rectal cancer, or Stomach cancer.  ROS:   Please see the history of present illness.     All other systems reviewed and are negative.  EKGs/Labs/Other Studies Reviewed:    The following studies were reviewed today:     Recent Labs: No results found for requested labs within last 8760 hours.  Recent Lipid Panel No results found for: CHOL, TRIG, HDL, CHOLHDL, VLDL, LDLCALC, LDLDIRECT  Physical Exam:    Physical Exam: Blood pressure 118/66, pulse 62, height 5' 6.5" (1.689 m), weight 156 lb 6.4 oz (70.9 kg), SpO2 97 %.  GEN:  Well nourished, well developed in no acute distress HEENT: Normal NECK: No JVD; No carotid bruits LYMPHATICS: No lymphadenopathy CARDIAC: RRR , no murmurs, rubs, gallops RESPIRATORY:  Clear to auscultation without rales, wheezing or rhonchi  ABDOMEN: Soft, non-tender, non-distended MUSCULOSKELETAL:  No edema; No deformity  SKIN: Warm and dry NEUROLOGIC:  Alert and oriented x 3  EKG:    Aug. 29, 2022.  NSR at 62.  No ST or T wave change.   ASSESSMENT:    1. Essential hypertension   2. Mixed hyperlipidemia   3. PVC's (premature ventricular contractions)    PLAN:      Essential hypertension:   BP is well controlled.  Cont meds.   Generalized deconditioning  Advised more exercise when he is able    Medication Adjustments/Labs and Tests Ordered: Current medicines are reviewed at length with the patient  today.  Concerns regarding medicines are outlined above.  Orders Placed This Encounter  Procedures   EKG 12-Lead    No orders of the defined types were placed in this encounter.   Patient Instructions  Medication Instructions:  Your physician recommends that you continue on your current medications as directed. Please refer to the Current Medication list given to you today.  *If you need a refill on your cardiac medications  before your next appointment, please call your pharmacy*   Lab Work: None Ordered If you have labs (blood work) drawn today and your tests are completely normal, you will receive your results only by: [place] (if you have MyChart) OR A paper copy in the mail If you have any lab test that is abnormal or we need to change your treatment, we will call you to review the results.   Testing/Procedures: None Ordered   Follow-Up: At [facility], you and your health needs are our priority.  As part of our continuing mission to provide you with exceptional heart care, we have created designated Provider Care Teams.  These Care Teams include your primary Cardiologist (physician) and Advanced Practice Providers (APPs -  Physician Assistants and Nurse Practitioners) who all work together to provide you with the care you need, when you need it.   Your next appointment:   1 year(s)  The format for your next appointment:   In Person  Provider:   You may see Mertie Moores, MD or one of the following Advanced Practice Providers on your designated Care Team:   Richardson Dopp, PA-C Robbie Lis, [place]    Signed, Mertie Moores, MD  02/16/2021 1:50 PM    [place]

## 2021-02-16 NOTE — Patient Instructions (Signed)

## 2021-05-09 ENCOUNTER — Other Ambulatory Visit: Payer: Self-pay | Admitting: Cardiovascular Disease

## 2021-08-29 ENCOUNTER — Other Ambulatory Visit: Payer: Self-pay | Admitting: Cardiovascular Disease

## 2022-01-25 ENCOUNTER — Encounter: Payer: Self-pay | Admitting: Cardiovascular Disease

## 2022-01-25 NOTE — Progress Notes (Unsigned)
Cardiology Office Note:    Date:  01/26/2022   ID:  Patrick Rios, DOB 1940-06-22, MRN [id number]  PCP:  Donnajean Lopes, MD  Cardiologist:  Mertie Moores, MD  Electrophysiologist:  None   Referring MD: Donnajean Lopes, MD   Chief Complaint  Patient presents with   Hypertension      History of Present Illness:    Patrick Rios is a 81 y.o. male with a hx of hypertension and bradycardia.  He has had some episodes of dizziness.  Have seen him approximately 10 years ago. Saw Dr. [name] for some dizziness and bradycardia.  Turned out to be in inner ear issue Resolved after he had some wax cleaned out from his ear  Saw Dr. Abran Gavigan Aspen last fall.   Was found to have HTN.  Did 24 hr BP monitor . BP has been generally elevated.   Still eats salty foods [name] every 2 weeks , sausage every 3 days , no hotdogs, no canned foods ,  Has been eating at restaurants 3 times a week ( sandwiches - steak and cheese, [place] , ) No regular exercise  Wife recently diagnosed with alzheimers - he has to stay around the house.   He does all the cooking  waks the dog several times a day - 1/2 mile twice. A day  No CP nor dyspnea  No pnd, orthopnea,  No orthostasis   Has gained weight over this past covid year   Aug. 29, 2022: Patrick Rios is seen for follow up of his HTN,  He had an episdose of syncope a year ago  Has occasional presyncope / lightheadedness. - seemed to be related to food getting stuck in his esophagus.  Has difficulty with endurance He has been getting more and more fatigued with daily activites  Cares for his wife with alzheimers so his time for exercise is very limited.    Aug. 8, 2023 Patrick Rios is seen today for follow up of his HTN, presyncope Is walking a mile a day  Has a caretaker for his wife . No CP or dyspnea with his walks   Dr. Jakiah Goree Aspen manages his lipids     Past Medical History:  Diagnosis Date   Allergy    Anemia    borderline   Arrhythmia    BPH (benign  prostatic hyperplasia)    Cancer (HCC)    melanoma- forehead, basal cell- nose, squamous- left of scalp   Chronic kidney disease    kidney stones   Diverticulosis    Hyperlipidemia    Hypertension     Past Surgical History:  Procedure Laterality Date   CHOLECYSTECTOMY  06/08/11   COLONOSCOPY  09/06/2019   SKIN CANCER EXCISION     x3   TONSILLECTOMY     UPPER GASTROINTESTINAL ENDOSCOPY  09/06/2019    Current Medications: Current Meds  Medication Sig   cetirizine (ZYRTEC) 10 MG tablet Take 10 mg by mouth daily.   chlorthalidone (HYGROTON) 25 MG tablet Take 1 tablet (25 mg total) by mouth daily.   fenofibrate (TRICOR) 145 MG tablet Take 145 mg by mouth daily.     losartan (COZAAR) 50 MG tablet Take 1 tablet (50 mg total) by mouth daily.   metoprolol (TOPROL-XL) 50 MG 24 hr tablet Take 50 mg by mouth daily.     Multiple Vitamins-Iron (DAILY MULTIVITAMINS/IRON PO) Take 1 tablet by mouth daily.     pantoprazole (PROTONIX) 40 MG tablet Take 40 mg by mouth daily.   potassium  chloride (KLOR-CON) 10 MEQ tablet TAKE ONE TABLET BY MOUTH ONCE DAILY   Psyllium (METAMUCIL PO) Take by mouth 2 (two) times daily.   simvastatin (ZOCOR) 20 MG tablet Take 20 mg by mouth daily.    Vitamin D, Ergocalciferol, (DRISDOL) 1.25 MG (50000 UNIT) CAPS capsule Take 50,000 Units by mouth once a week.   Current Facility-Administered Medications for the 01/26/22 encounter (Office Visit) with Patrick Rios, Wonda Cheng, MD  Medication   0.9 %  sodium chloride infusion     Allergies:   Patient has no known allergies.   Social History   Socioeconomic History   Marital status: Married    Spouse name: Not on file   Number of children: 2   Years of education: Not on file   Highest education level: Not on file  Occupational History   Occupation: Retired    Fish farm manager: RETIRED  Tobacco Use   Smoking status: Never   Smokeless tobacco: Never  Vaping Use   Vaping Use: Never used  Substance and Sexual Activity    Alcohol use: Not Currently    Comment: hasnt drank since 2016   Drug use: No   Sexual activity: Not on file  Other Topics Concern   Not on file  Social History Narrative   Not on file   Social Determinants of Health   Financial Resource Strain: Not on file  Food Insecurity: Not on file  Transportation Needs: Not on file  Physical Activity: Not on file  Stress: Not on file  Social Connections: Not on file     Family History: The patient's family history includes Bladder Cancer in his brother. There is no history of Colon cancer, Esophageal cancer, Rectal cancer, or Stomach cancer.  ROS:   Please see the history of present illness.     All other systems reviewed and are negative.  EKGs/Labs/Other Studies Reviewed:    The following studies were reviewed today:   Recent Labs: No results found for requested labs within last 365 days.  Recent Lipid Panel No results found for: "CHOL", "TRIG", "HDL", "CHOLHDL", "VLDL", "LDLCALC", "LDLDIRECT"  Physical Exam:   Physical Exam: Blood pressure 128/62, pulse (!) 54, height '5\' 6"'$  (1.676 m), weight 160 lb 3.2 oz (72.7 kg), SpO2 96 %.  GEN:  Well nourished, well developed in no acute distress HEENT: Normal NECK: No JVD; No carotid bruits LYMPHATICS: No lymphadenopathy CARDIAC: RRR , no murmurs, rubs, gallops RESPIRATORY:  Clear to auscultation without rales, wheezing or rhonchi  ABDOMEN: Soft, non-tender, non-distended MUSCULOSKELETAL:  No edema; No deformity  SKIN: Warm and dry NEUROLOGIC:  Alert and oriented x 3   EKG:      January 26, 2022: Sinus bradycardia 54.  No ST or T wave changes.  ASSESSMENT:    1. Essential hypertension   2. Dizziness     PLAN:      Essential hypertension:    Blood pressure is well controlled.  Continue current medications.  2.  Hyperlipidemia: He is currently on simvastatin.  Dr. Mc Bloodworth Aspen has been managing his lipids.      Medication Adjustments/Labs and Tests Ordered: Current  medicines are reviewed at length with the patient today.  Concerns regarding medicines are outlined above.  Orders Placed This Encounter  Procedures   EKG 12-Lead    No orders of the defined types were placed in this encounter.    Patient Instructions  Medication Instructions:  Your physician recommends that you continue on your current medications as directed. Please refer  to the Current Medication list given to you today.  *If you need a refill on your cardiac medications before your next appointment, please call your pharmacy*  Lab Work: If you have labs (blood work) drawn today and your tests are completely normal, you will receive your results only by: [place] (if you have MyChart) OR A paper copy in the mail If you have any lab test that is abnormal or we need to change your treatment, we will call you to review the results.  Testing/Procedures: None ordered today.  Follow-Up: At [facility], you and your health needs are our priority.  As part of our continuing mission to provide you with exceptional heart care, we have created designated Provider Care Teams.  These Care Teams include your primary Cardiologist (physician) and Advanced Practice Providers (APPs -  Physician Assistants and Nurse Practitioners) who all work together to provide you with the care you need, when you need it.  We recommend signing up for the patient portal called "MyChart".  Sign up information is provided on this After Visit Summary.  MyChart is used to connect with patients for Virtual Visits (Telemedicine).  Patients are able to view lab/test results, encounter notes, upcoming appointments, etc.  Non-urgent messages can be sent to your provider as well.   To learn more about what you can do with MyChart, go to [email or web address].    Your next appointment:   12 month(s)  The format for your next appointment:   In Person  Provider:   Mertie Moores, MD {   Other  Instructions   Important Information About Sugar         Signed, Mertie Moores, MD  01/26/2022 4:19 PM    [place]

## 2022-01-26 ENCOUNTER — Ambulatory Visit: Payer: Medicare Other | Admitting: Cardiovascular Disease

## 2022-01-26 ENCOUNTER — Encounter: Payer: Self-pay | Admitting: Cardiovascular Disease

## 2022-01-26 VITALS — BP 128/62 | HR 54 | Ht 66.0 in | Wt 160.2 lb

## 2022-01-26 DIAGNOSIS — I10 Essential (primary) hypertension: Secondary | ICD-10-CM

## 2022-01-26 DIAGNOSIS — R42 Dizziness and giddiness: Secondary | ICD-10-CM

## 2022-01-26 NOTE — Patient Instructions (Addendum)
Medication Instructions:  Your physician recommends that you continue on your current medications as directed. Please refer to the Current Medication list given to you today.  *If you need a refill on your cardiac medications before your next appointment, please call your pharmacy*  Lab Work: If you have labs (blood work) drawn today and your tests are completely normal, you will receive your results only by: [place] (if you have MyChart) OR A paper copy in the mail If you have any lab test that is abnormal or we need to change your treatment, we will call you to review the results.  Testing/Procedures: None ordered today.  Follow-Up: At [facility], you and your health needs are our priority.  As part of our continuing mission to provide you with exceptional heart care, we have created designated Provider Care Teams.  These Care Teams include your primary Cardiologist (physician) and Advanced Practice Providers (APPs -  Physician Assistants and Nurse Practitioners) who all work together to provide you with the care you need, when you need it.  We recommend signing up for the patient portal called "MyChart".  Sign up information is provided on this After Visit Summary.  MyChart is used to connect with patients for Virtual Visits (Telemedicine).  Patients are able to view lab/test results, encounter notes, upcoming appointments, etc.  Non-urgent messages can be sent to your provider as well.   To learn more about what you can do with MyChart, go to [email or web address].    Your next appointment:   12 month(s)  The format for your next appointment:   In Person  Provider:   Mertie Moores, MD {   Other Instructions   Important Information About Sugar

## 2022-01-30 ENCOUNTER — Other Ambulatory Visit: Payer: Self-pay | Admitting: Cardiovascular Disease

## 2022-02-01 ENCOUNTER — Other Ambulatory Visit: Payer: Self-pay

## 2022-02-06 ENCOUNTER — Other Ambulatory Visit: Payer: Self-pay | Admitting: Cardiovascular Disease

## 2022-03-12 ENCOUNTER — Telehealth: Payer: Self-pay

## 2022-03-12 ENCOUNTER — Encounter: Payer: Self-pay | Admitting: Cardiovascular Disease

## 2022-03-12 DIAGNOSIS — I6523 Occlusion and stenosis of bilateral carotid arteries: Secondary | ICD-10-CM

## 2022-03-12 NOTE — Telephone Encounter (Signed)
Order placed for carotid ultrasound and routed to [facility] for scheduling.

## 2022-03-12 NOTE — Progress Notes (Signed)
Pt presented in person to the office today   He recently had dental x rays  Was found to have carotid artery calcification  Is asymptomatic  Is on ASA 81 mg a day Hx of HLD - in on Simvastatin  We will order carotid duplex scan for further evaluation His LDL goal is now 55.  Will see back at his previously scheduled appt. In 1 year   Copy to Dr. Roylene Heaton Aspen .    Mertie Moores, MD  03/12/2022 12:29 PM    [address],  [place], [place]  [address] Phone: [phone]; Fax: [phone]

## 2022-03-12 NOTE — Telephone Encounter (Signed)
-----   Message from Thayer Headings, MD sent at 03/12/2022 12:32 PM EDT ----- Judson Roch,   Mr. Rosenwald walked in today at lunch   He had dental xrays which showed carotid artery calcification today .  Will you order a carotid duplex scan on Mr. Taketa. Ivin Booty is scheduling the procedure .    Thank you ,  Abbe Amsterdam

## 2022-03-17 ENCOUNTER — Ambulatory Visit (HOSPITAL_COMMUNITY)
Admission: RE | Admit: 2022-03-17 | Discharge: 2022-03-17 | Disposition: A | Discharge status: Home / Self Care | Payer: Medicare Other | Source: Ambulatory Visit | Attending: Cardiology | Admitting: Cardiology

## 2022-03-17 DIAGNOSIS — I6523 Occlusion and stenosis of bilateral carotid arteries: Secondary | ICD-10-CM | POA: Insufficient documentation

## 2022-03-18 ENCOUNTER — Telehealth: Payer: Self-pay

## 2022-03-18 DIAGNOSIS — I6523 Occlusion and stenosis of bilateral carotid arteries: Secondary | ICD-10-CM

## 2022-03-18 NOTE — Telephone Encounter (Signed)
-----   Message from Thayer Headings, MD sent at 03/17/2022  8:07 PM EDT ----- Right ICA - moderate carotid disease Left ICA - mild carotid disease   Repeat study in 1 year

## 2022-03-18 NOTE — Telephone Encounter (Signed)
Called patient and reviewed results. He understands the need to repeat study in 1 year. Order placed at this time.

## 2022-05-29 ENCOUNTER — Other Ambulatory Visit: Payer: Self-pay | Admitting: Cardiovascular Disease

## 2022-09-11 ENCOUNTER — Other Ambulatory Visit: Payer: Self-pay | Admitting: Cardiovascular Disease

## 2022-11-08 ENCOUNTER — Emergency Department (HOSPITAL_BASED_OUTPATIENT_CLINIC_OR_DEPARTMENT_OTHER): Payer: Medicare Other | Admitting: Radiology

## 2022-11-08 ENCOUNTER — Emergency Department (HOSPITAL_BASED_OUTPATIENT_CLINIC_OR_DEPARTMENT_OTHER)
Admission: EM | Admit: 2022-11-08 | Discharge: 2022-11-08 | Disposition: A | Discharge status: Home / Self Care | Payer: Medicare Other | Attending: Emergency Medicine | Admitting: Emergency Medicine

## 2022-11-08 ENCOUNTER — Encounter (HOSPITAL_BASED_OUTPATIENT_CLINIC_OR_DEPARTMENT_OTHER): Payer: Self-pay

## 2022-11-08 ENCOUNTER — Other Ambulatory Visit: Payer: Self-pay

## 2022-11-08 DIAGNOSIS — S6991XA Unspecified injury of right wrist, hand and finger(s), initial encounter: Secondary | ICD-10-CM | POA: Diagnosis present

## 2022-11-08 DIAGNOSIS — N189 Chronic kidney disease, unspecified: Secondary | ICD-10-CM | POA: Insufficient documentation

## 2022-11-08 DIAGNOSIS — X501XXA Overexertion from prolonged static or awkward postures, initial encounter: Secondary | ICD-10-CM | POA: Diagnosis not present

## 2022-11-08 DIAGNOSIS — I129 Hypertensive chronic kidney disease with stage 1 through stage 4 chronic kidney disease, or unspecified chronic kidney disease: Secondary | ICD-10-CM | POA: Diagnosis not present

## 2022-11-08 DIAGNOSIS — Z7982 Long term (current) use of aspirin: Secondary | ICD-10-CM | POA: Diagnosis not present

## 2022-11-08 NOTE — Discharge Instructions (Signed)
There is a likely a large bruise on the back of her hand.  A break is felt less likely and the x-ray did not show it.  The Ace wrap can help with the swelling.  Follow-up with doctor symptoms do not improve or pain worsens.

## 2022-11-08 NOTE — ED Triage Notes (Addendum)
Patient here POV from Home.  Endorse injuring his Right Posterior Wrist while fixing his toilet. Occurred a 1.5 Hours ago. Swelling and bruising to Same.  NAD Noted during Triage. A&Ox4. Gcs 15. Ambulatory.

## 2022-11-08 NOTE — ED Notes (Signed)
Discharge instructions, follow up care, and pain management reviewed and explained, ice pack provided, pt verbalized understanding and had no further questions on d/c.

## 2022-11-08 NOTE — ED Provider Notes (Signed)
[place] EMERGENCY DEPARTMENT AT [facility] Provider Note   CSN: [id number] Arrival date & time: 11/08/22  1119     History  Chief Complaint  Patient presents with   Wrist Injury    Patrick Rios is a 82 y.o. male.   Wrist Injury Patient presents with right wrist injury.  Was fixing a toilet and states his hand got stuck in the wrist.  States he pulled it out and now has swelling and pain.  Swelling of dorsum of wrist.  No other injury.  Not on blood thinners.   Past Medical History:  Diagnosis Date   Allergy    Anemia    borderline   Arrhythmia    BPH (benign prostatic hyperplasia)    Cancer (HCC)    melanoma- forehead, basal cell- nose, squamous- left of scalp   Chronic kidney disease    kidney stones   Diverticulosis    Hyperlipidemia    Hypertension     Home Medications Prior to Admission medications   Medication Sig Start Date End Date Taking? Authorizing Provider  aspirin EC 81 MG tablet Take 1 tablet by mouth daily. 05/12/09   [provider]  Calcium Carbonate-Vitamin D 600-400 MG-UNIT tablet Take 1 tablet by mouth daily.  Patient not taking: Reported on 01/26/2022    [provider]  cetirizine (ZYRTEC) 10 MG tablet Take 10 mg by mouth daily.    [provider]  chlorthalidone (HYGROTON) 25 MG tablet Take 1 tablet (25 mg total) by mouth daily. 02/01/22   Nahser, Deloris Ping, MD  fenofibrate (TRICOR) 145 MG tablet Take 145 mg by mouth daily.      [provider]  losartan (COZAAR) 50 MG tablet Take 1 tablet (50 mg total) by mouth daily. 02/08/22   Nahser, Deloris Ping, MD  metoprolol succinate (TOPROL-XL) 50 MG 24 hr tablet TAKE 1 TABLET BY MOUTH EACH DAY. 09/13/22   Nahser, Deloris Ping, MD  Multiple Vitamins-Iron (DAILY MULTIVITAMINS/IRON PO) Take 1 tablet by mouth daily.      [provider]  pantoprazole (PROTONIX) 40 MG tablet Take 40 mg by mouth daily.    [provider]  potassium chloride (KLOR-CON) 10  MEQ tablet TAKE ONE TABLET BY MOUTH ONCE DAILY 05/31/22   Nahser, Deloris Ping, MD  Psyllium (METAMUCIL PO) Take by mouth 2 (two) times daily.    [provider]  simvastatin (ZOCOR) 20 MG tablet Take 20 mg by mouth daily.  03/13/11   [provider]  Vitamin D, Ergocalciferol, (DRISDOL) 1.25 MG (50000 UNIT) CAPS capsule Take 50,000 Units by mouth once a week. 07/28/19   [provider]      Allergies    Patient has no known allergies.    Review of Systems   Review of Systems  Physical Exam Updated Vital Signs BP (!) 155/56 (BP Location: Right Arm)   Pulse 60   Temp (!) 96.4 F (35.8 C) (Temporal)   Resp 18   Ht 5\' 6"  (1.676 m)   Wt 70.3 kg   SpO2 100%   BMI 25.02 kg/m  Physical Exam Vitals reviewed.  Musculoskeletal:     Comments: On dorsum of right wrist there is ecchymosis, swelling and does have fluctuant area.  Does however have relatively good movement in the wrist and good flexion and extension and sensation in his fingers.  Neurological:     Mental Status: He is alert.     ED Results / Procedures / Treatments  Labs (all labs ordered are listed, but only abnormal results are displayed) Labs Reviewed - No data to display  EKG None  Radiology DG Wrist Complete Right  Result Date: 11/08/2022 CLINICAL DATA:  Injury to right hand. EXAM: RIGHT WRIST - COMPLETE 3+ VIEW COMPARISON:  None Available. FINDINGS: There is no definite fracture or dislocation. Significant dorsal soft tissue swelling centered at the level of the second metacarpal rule. 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 Wispy calcifications at the radiocarpal joint. IMPRESSION: 1. No definite fracture or dislocation identified about the right wrist. 2. Significant dorsal soft tissue swelling centered at the level of the second metacarpal. Recommend immobilization and repeated radiograph in 7-10 days, or cross-sectional imaging to exclude radio occult fracture. 3.  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 Electronically Signed   By: Ted Mcalpine M.D.   On: 11/08/2022 11:51    Procedures Procedures    Medications Ordered in ED Medications - No data to display  ED Course/ Medical Decision Making/ A&P                             Medical Decision Making Amount and/or Complexity of Data Reviewed Radiology: ordered.   Patient with right wrist injury.  Likely hematoma dorsally.  Will get x-ray to evaluate for cause such as fracture.  Neurovascular intact.  Doubt severe tendon injury.  X-ray negative for fracture.  Reviewed read and although they state they cannot rule out occult fracture I doubt severe occult fracture this time.  Will treat with Ace bandage and outpatient follow-up.  Do not think we need immobilization at this time.  Discussed with patient will discharge home.        Final Clinical Impression(s) / ED Diagnoses Final diagnoses:  Wrist injury, right, initial encounter    Rx / DC Orders ED Discharge Orders     None         Benjiman Core, MD 11/08/22 1214

## 2022-12-05 ENCOUNTER — Other Ambulatory Visit: Payer: Self-pay | Admitting: Cardiovascular Disease

## 2023-01-30 ENCOUNTER — Other Ambulatory Visit: Payer: Self-pay | Admitting: Cardiovascular Disease

## 2023-02-03 ENCOUNTER — Encounter: Payer: Self-pay | Admitting: Cardiovascular Disease

## 2023-02-03 NOTE — Progress Notes (Signed)
Cardiology Office Note:    Date:  02/04/2023   ID:  Patrick Rios, DOB [birth year], MRN [id number]  PCP:  Garlan Fillers, MD  Cardiologist:  Kristeen Miss, MD  Electrophysiologist:  None   Referring MD: Garlan Fillers, MD   Chief Complaint  Patient presents with   Hypertension           History of Present Illness:    Patrick Rios is a 82 y.o. male with a hx of hypertension and bradycardia.  He has had some episodes of dizziness.  Have seen him approximately 10 years ago. Saw Dr. Allyson Sabal for some dizziness and bradycardia.  Turned out to be in inner ear issue Resolved after he had some wax cleaned out from his ear  Saw Dr. Eloise Harman last fall.   Was found to have HTN.  Did 24 hr BP monitor . BP has been generally elevated.   Still eats salty foods Tomasa Blase every 2 weeks , sausage every 3 days , no hotdogs, no canned foods ,  Has been eating at restaurants 3 times a week ( sandwiches - steak and cheese, [place] , ) No regular exercise  Wife recently diagnosed with alzheimers - he has to stay around the house.   He does all the cooking  waks the dog several times a day - 1/2 mile twice. A day  No CP nor dyspnea  No pnd, orthopnea,  No orthostasis   Has gained weight over this past covid year   Aug. 29, 2022: Patrick Rios is seen for follow up of his HTN,  He had an episdose of syncope a year ago  Has occasional presyncope / lightheadedness. - seemed to be related to food getting stuck in his esophagus.  Has difficulty with endurance He has been getting more and more fatigued with daily activites  Cares for his wife with alzheimers so his time for exercise is very limited.    Aug. 8, 2023 Patrick Rios is seen today for follow up of his HTN, presyncope Is walking a mile a day  Has a caretaker for his wife . No CP or dyspnea with his walks   Dr. Eloise Harman manages his lipids   Feb 04, 2023 Patrick Rios is seen for follow up of his HTN, HLD, presyncope     Past Medical History:   Diagnosis Date   Allergy    Anemia    borderline   Arrhythmia    BPH (benign prostatic hyperplasia)    Cancer (HCC)    melanoma- forehead, basal cell- nose, squamous- left of scalp   Chronic kidney disease    kidney stones   Diverticulosis    Hyperlipidemia    Hypertension     Past Surgical History:  Procedure Laterality Date   CHOLECYSTECTOMY  06/08/11   COLONOSCOPY  09/06/2019   SKIN CANCER EXCISION     x3   TONSILLECTOMY     UPPER GASTROINTESTINAL ENDOSCOPY  09/06/2019    Current Medications: Current Meds  Medication Sig   aspirin EC 81 MG tablet Take 1 tablet by mouth daily.   cetirizine (ZYRTEC) 10 MG tablet Take 10 mg by mouth daily.   chlorthalidone (HYGROTON) 25 MG tablet Take 1 tablet (25 mg total) by mouth daily.   fenofibrate (TRICOR) 145 MG tablet Take 145 mg by mouth daily.     losartan (COZAAR) 50 MG tablet Take 1 tablet (50 mg total) by mouth daily.   metoprolol succinate (TOPROL XL) 25 MG 24 hr tablet Take 1 tablet (  25 mg total) by mouth daily.   Multiple Vitamins-Iron (DAILY MULTIVITAMINS/IRON PO) Take 1 tablet by mouth daily.     pantoprazole (PROTONIX) 40 MG tablet Take 40 mg by mouth daily.   potassium chloride (KLOR-CON) 10 MEQ tablet TAKE ONE TABLET BY MOUTH ONCE DAILY   Psyllium (METAMUCIL PO) Take by mouth 2 (two) times daily.   rosuvastatin (CRESTOR) 10 MG tablet Take 1 tablet (10 mg total) by mouth daily.   Vitamin D, Ergocalciferol, (DRISDOL) 1.25 MG (50000 UNIT) CAPS capsule Take 50,000 Units by mouth once a week.   [DISCONTINUED] metoprolol succinate (TOPROL-XL) 50 MG 24 hr tablet TAKE 1 TABLET BY MOUTH EACH DAY.   [DISCONTINUED] simvastatin (ZOCOR) 20 MG tablet Take 20 mg by mouth daily.    Current Facility-Administered Medications for the 02/04/23 encounter (Office Visit) with Cyara Devoto, Deloris Ping, MD  Medication   0.9 %  sodium chloride infusion     Allergies:   Patient has no known allergies.   Social History   Socioeconomic History    Marital status: Married    Spouse name: Not on file   Number of children: 2   Years of education: Not on file   Highest education level: Not on file  Occupational History   Occupation: Retired    Associate Professor: RETIRED  Tobacco Use   Smoking status: Never   Smokeless tobacco: Never  Vaping Use   Vaping status: Never Used  Substance and Sexual Activity   Alcohol use: Not Currently    Comment: hasnt drank since 2016   Drug use: No   Sexual activity: Not on file  Other Topics Concern   Not on file  Social History Narrative   Not on file   Social Determinants of Health   Financial Resource Strain: Not on file  Food Insecurity: Not on file  Transportation Needs: Not on file  Physical Activity: Not on file  Stress: Not on file  Social Connections: Not on file     Family History: The patient's family history includes Bladder Cancer in his brother. There is no history of Colon cancer, Esophageal cancer, Rectal cancer, or Stomach cancer.  ROS:   Please see the history of present illness.     All other systems reviewed and are negative.  EKGs/Labs/Other Studies Reviewed:    The following studies were reviewed today:   Recent Labs: No results found for requested labs within last 365 days.  Recent Lipid Panel No results found for: "CHOL", "TRIG", "HDL", "CHOLHDL", "VLDL", "LDLCALC", "LDLDIRECT"  Physical Exam:    Physical Exam: Blood pressure (!) 124/58, pulse (!) 52, height 5\' 6"  (1.676 m), weight 150 lb (68 kg), SpO2 98%.       GEN:  Well nourished, well developed in no acute distress HEENT: Normal NECK: No JVD; No carotid bruits LYMPHATICS: No lymphadenopathy CARDIAC: RRR , no murmurs, rubs, gallops RESPIRATORY:  Clear to auscultation without rales, wheezing or rhonchi  ABDOMEN: Soft, non-tender, non-distended MUSCULOSKELETAL:  No edema; No deformity  SKIN: Warm and dry NEUROLOGIC:  Alert and oriented x 3   EKG:       EKG Interpretation Date/Time:  Friday  February 04 2023 14:15:30 EDT Ventricular Rate:  52 PR Interval:  190 QRS Duration:  82 QT Interval:  428 QTC Calculation: 398 R Axis:   25  Text Interpretation: Sinus bradycardia at 52 with Premature atrial complexes Confirmed by Kristeen Miss ([id number]) on 02/04/2023 2:36:08 PM    ASSESSMENT:    1. Essential hypertension  2. Bilateral carotid artery disease, unspecified type (HCC)   3. Mixed hyperlipidemia      PLAN:      Essential hypertension:   BP is well controlled.    2.  Hyperlipidemia:   his LDL goal is < 70.   Last LDL was 79 Will DC simva Start rosuvastatin 10 mg a day  Check lipids, ALT in 3 months         Medication Adjustments/Labs and Tests Ordered: Current medicines are reviewed at length with the patient today.  Concerns regarding medicines are outlined above.  Orders Placed This Encounter  Procedures   Lipid panel   ALT   EKG 12-Lead    Meds ordered this encounter  Medications   rosuvastatin (CRESTOR) 10 MG tablet    Sig: Take 1 tablet (10 mg total) by mouth daily.    Dispense:  90 tablet    Refill:  3    Replaces ZOCOR   metoprolol succinate (TOPROL XL) 25 MG 24 hr tablet    Sig: Take 1 tablet (25 mg total) by mouth daily.    Dispense:  90 tablet    Refill:  3    Dose DECREASE     Patient Instructions  Medication Instructions:  STOP Simvastatin DECREASE Metoprolol Succinate/Toprol XL to 25mg  daily START Crestor/Rosuvastatin 10mg  daily *If you need a refill on your cardiac medications before your next appointment, please call your pharmacy*   Lab Work: Lipids, ALT in 3 months If you have labs (blood work) drawn today and your tests are completely normal, you will receive your results only by: MyChart Message (if you have MyChart) OR A paper copy in the mail If you have any lab test that is abnormal or we need to change your treatment, we will call you to review the results.   Testing/Procedures: NONE   Follow-Up: At [facility], you and your health needs are our priority.  As part of our continuing mission to provide you with exceptional heart care, we have created designated Provider Care Teams.  These Care Teams include your primary Cardiologist (physician) and Advanced Practice Providers (APPs -  Physician Assistants and Nurse Practitioners) who all work together to provide you with the care you need, when you need it.  We recommend signing up for the patient portal called "MyChart".  Sign up information is provided on this After Visit Summary.  MyChart is used to connect with patients for Virtual Visits (Telemedicine).  Patients are able to view lab/test results, encounter notes, upcoming appointments, etc.  Non-urgent messages can be sent to your provider as well.   To learn more about what you can do with MyChart, go to [email or web address].    Your next appointment:   1 year(s)  Provider:   Kristeen Miss, MD        Signed, Kristeen Miss, MD  02/04/2023 2:48 PM    [facility]

## 2023-02-04 ENCOUNTER — Ambulatory Visit: Payer: Medicare Other | Attending: Cardiovascular Disease | Admitting: Cardiovascular Disease

## 2023-02-04 ENCOUNTER — Encounter: Payer: Self-pay | Admitting: Cardiovascular Disease

## 2023-02-04 VITALS — BP 124/58 | HR 52 | Ht 66.0 in | Wt 150.0 lb

## 2023-02-04 DIAGNOSIS — E782 Mixed hyperlipidemia: Secondary | ICD-10-CM | POA: Diagnosis not present

## 2023-02-04 DIAGNOSIS — I779 Disorder of arteries and arterioles, unspecified: Secondary | ICD-10-CM | POA: Diagnosis not present

## 2023-02-04 DIAGNOSIS — I10 Essential (primary) hypertension: Secondary | ICD-10-CM

## 2023-02-04 MED ORDER — METOPROLOL SUCCINATE ER 25 MG PO TB24: 25.0000 mg | ORAL_TABLET | Freq: Every day | ORAL | 3 refills | Status: DC

## 2023-02-04 MED ORDER — ROSUVASTATIN CALCIUM 10 MG PO TABS: 10.0000 mg | ORAL_TABLET | Freq: Every day | ORAL | 3 refills | Status: DC

## 2023-02-04 NOTE — Patient Instructions (Signed)
Medication Instructions:  STOP Simvastatin DECREASE Metoprolol Succinate/Toprol XL to 25mg  daily START Crestor/Rosuvastatin 10mg  daily *If you need a refill on your cardiac medications before your next appointment, please call your pharmacy*   Lab Work: Lipids, ALT in 3 months If you have labs (blood work) drawn today and your tests are completely normal, you will receive your results only by: MyChart Message (if you have MyChart) OR A paper copy in the mail If you have any lab test that is abnormal or we need to change your treatment, we will call you to review the results.   Testing/Procedures: NONE   Follow-Up: At [facility], you and your health needs are our priority.  As part of our continuing mission to provide you with exceptional heart care, we have created designated Provider Care Teams.  These Care Teams include your primary Cardiologist (physician) and Advanced Practice Providers (APPs -  Physician Assistants and Nurse Practitioners) who all work together to provide you with the care you need, when you need it.  We recommend signing up for the patient portal called "MyChart".  Sign up information is provided on this After Visit Summary.  MyChart is used to connect with patients for Virtual Visits (Telemedicine).  Patients are able to view lab/test results, encounter notes, upcoming appointments, etc.  Non-urgent messages can be sent to your provider as well.   To learn more about what you can do with MyChart, go to [email or web address].    Your next appointment:   1 year(s)  Provider:   Kristeen Miss, MD

## 2023-02-19 ENCOUNTER — Other Ambulatory Visit: Payer: Self-pay | Admitting: Cardiovascular Disease

## 2023-03-06 ENCOUNTER — Other Ambulatory Visit: Payer: Self-pay | Admitting: Cardiovascular Disease

## 2023-03-18 ENCOUNTER — Ambulatory Visit (HOSPITAL_COMMUNITY)
Admission: RE | Admit: 2023-03-18 | Discharge: 2023-03-18 | Disposition: A | Discharge status: Home / Self Care | Payer: Medicare Other | Source: Ambulatory Visit | Attending: Internal Medicine | Admitting: Internal Medicine

## 2023-03-18 DIAGNOSIS — I6523 Occlusion and stenosis of bilateral carotid arteries: Secondary | ICD-10-CM | POA: Insufficient documentation

## 2023-03-19 ENCOUNTER — Other Ambulatory Visit: Payer: Self-pay | Admitting: Cardiovascular Disease

## 2023-03-21 ENCOUNTER — Telehealth: Payer: Self-pay

## 2023-03-21 DIAGNOSIS — I6523 Occlusion and stenosis of bilateral carotid arteries: Secondary | ICD-10-CM

## 2023-03-21 NOTE — Telephone Encounter (Signed)
-----   Message from Kristeen Miss sent at 03/20/2023  9:09 PM EDT ----- Mild bilateral carotid artery disease Repeat carotid duplex in 1 year

## 2023-03-21 NOTE — Telephone Encounter (Signed)
Called and spoke with patient who verbalized understanding of results. Repeat study order placed at this time to be done in 1 year.

## 2023-05-09 ENCOUNTER — Ambulatory Visit: Payer: Medicare Other | Attending: Cardiovascular Disease

## 2023-05-09 DIAGNOSIS — E782 Mixed hyperlipidemia: Secondary | ICD-10-CM

## 2023-05-09 DIAGNOSIS — I779 Disorder of arteries and arterioles, unspecified: Secondary | ICD-10-CM

## 2023-05-09 DIAGNOSIS — I10 Essential (primary) hypertension: Secondary | ICD-10-CM

## 2023-05-09 LAB — LIPID PANEL
Chol/HDL Ratio: 2.6 ratio (ref 0.0–5.0)
Cholesterol, Total: 124 mg/dL (ref 100–199)
HDL: 47 mg/dL (ref >39)
LDL Chol Calc (NIH): 60 mg/dL (ref 0–99)
Triglycerides: 87 mg/dL (ref 0–149)
VLDL Cholesterol Cal: 17 mg/dL (ref 5–40)

## 2023-05-09 LAB — ALT: ALT: 16 [IU]/L (ref 0–44)

## 2023-07-18 ENCOUNTER — Telehealth: Payer: Self-pay | Admitting: Diagnostic Neuroimaging

## 2023-07-18 NOTE — Telephone Encounter (Signed)
Pt cx appt. Will call back to r/s

## 2023-07-25 ENCOUNTER — Ambulatory Visit: Payer: Medicare Other | Admitting: Diagnostic Neuroimaging

## 2023-11-19 ENCOUNTER — Other Ambulatory Visit: Payer: Self-pay | Admitting: Cardiovascular Disease

## 2024-01-24 ENCOUNTER — Other Ambulatory Visit: Payer: Self-pay

## 2024-01-24 DIAGNOSIS — I10 Essential (primary) hypertension: Secondary | ICD-10-CM

## 2024-01-24 DIAGNOSIS — E782 Mixed hyperlipidemia: Secondary | ICD-10-CM

## 2024-01-24 DIAGNOSIS — I779 Disorder of arteries and arterioles, unspecified: Secondary | ICD-10-CM

## 2024-01-24 MED ORDER — ROSUVASTATIN CALCIUM 10 MG PO TABS: 10.0000 mg | ORAL_TABLET | Freq: Every day | ORAL | 0 refills | Status: DC

## 2024-02-06 ENCOUNTER — Other Ambulatory Visit: Payer: Self-pay | Admitting: Physician Assistant

## 2024-02-07 ENCOUNTER — Other Ambulatory Visit: Payer: Self-pay | Admitting: Physician Assistant

## 2024-02-08 MED ORDER — CHLORTHALIDONE 25 MG PO TABS: 25.0000 mg | ORAL_TABLET | Freq: Every day | ORAL | 0 refills | Status: DC

## 2024-02-21 ENCOUNTER — Other Ambulatory Visit: Payer: Self-pay

## 2024-02-21 MED ORDER — POTASSIUM CHLORIDE ER 10 MEQ PO TBCR: 10.0000 meq | EXTENDED_RELEASE_TABLET | Freq: Every day | ORAL | 0 refills | Status: DC

## 2024-02-27 ENCOUNTER — Other Ambulatory Visit: Payer: Self-pay

## 2024-02-27 MED ORDER — LOSARTAN POTASSIUM 50 MG PO TABS: 50.0000 mg | ORAL_TABLET | Freq: Every day | ORAL | 0 refills | Status: DC

## 2024-03-19 ENCOUNTER — Ambulatory Visit (HOSPITAL_COMMUNITY)
Admission: RE | Admit: 2024-03-19 | Discharge: 2024-03-19 | Disposition: A | Discharge status: Home / Self Care | Source: Ambulatory Visit | Attending: Cardiovascular Disease | Admitting: Cardiovascular Disease

## 2024-03-19 DIAGNOSIS — I6523 Occlusion and stenosis of bilateral carotid arteries: Secondary | ICD-10-CM | POA: Insufficient documentation

## 2024-03-20 ENCOUNTER — Ambulatory Visit: Payer: Self-pay | Admitting: *Deleted

## 2024-04-09 ENCOUNTER — Encounter (HOSPITAL_BASED_OUTPATIENT_CLINIC_OR_DEPARTMENT_OTHER): Payer: Self-pay

## 2024-04-12 ENCOUNTER — Ambulatory Visit (HOSPITAL_BASED_OUTPATIENT_CLINIC_OR_DEPARTMENT_OTHER): Admitting: Cardiovascular Disease

## 2024-04-12 ENCOUNTER — Encounter (HOSPITAL_BASED_OUTPATIENT_CLINIC_OR_DEPARTMENT_OTHER): Payer: Self-pay | Admitting: Cardiovascular Disease

## 2024-04-12 VITALS — BP 134/64 | HR 68 | Ht 66.0 in | Wt 152.5 lb

## 2024-04-12 DIAGNOSIS — E7800 Pure hypercholesterolemia, unspecified: Secondary | ICD-10-CM

## 2024-04-12 DIAGNOSIS — I10 Essential (primary) hypertension: Secondary | ICD-10-CM

## 2024-04-12 DIAGNOSIS — R2241 Localized swelling, mass and lump, right lower limb: Secondary | ICD-10-CM | POA: Diagnosis not present

## 2024-04-12 DIAGNOSIS — I6523 Occlusion and stenosis of bilateral carotid arteries: Secondary | ICD-10-CM

## 2024-04-12 NOTE — Patient Instructions (Signed)
 Medication Instructions:  Your physician recommends that you continue on your current medications as directed. Please refer to the Current Medication list given to you today.   *If you need a refill on your cardiac medications before your next appointment, please call your pharmacy*  Lab Work: NONE  Testing/Procedures: Your physician has requested that you have a lower or upper extremity venous duplex. This test is an ultrasound of the veins in the legs or arms. It looks at venous blood flow that carries blood from the heart to the legs or arms. Allow one hour for a Lower Venous exam. Allow thirty minutes for an Upper Venous exam. There are no restrictions or special instructions.  Please note: We ask at that you not bring children with you during ultrasound (echo/ vascular) testing. Due to room size and safety concerns, children are not allowed in the ultrasound rooms during exams. Our front office staff cannot provide observation of children in our lobby area while testing is being conducted. An adult accompanying a patient to their appointment will only be allowed in the ultrasound room at the discretion of the ultrasound technician under special circumstances. We apologize for any inconvenience.  Your physician has requested that you have a carotid duplex. This test is an ultrasound of the carotid arteries in your neck. It looks at blood flow through these arteries that supply the brain with blood. Allow one hour for this exam. There are no restrictions or special instructions. TO BE DONE IN 1 YEAR ABOUT A WEEK PRIOR TO FOLLOW UP   Follow-Up: At [facility], you and your health needs are our priority.  As part of our continuing mission to provide you with exceptional heart care, our providers are all part of one team.  This team includes your primary Cardiologist (physician) and Advanced Practice Providers or APPs (Physician Assistants and Nurse Practitioners) who all work together to  provide you with the care you need, when you need it.  Your next appointment:   12 month(s) ABOUT A WEEK AFTER CAROTID   Provider:   Annabella Scarce, MD, Rosaline Bane, NP, or Reche Finder, NP   KRISTIN A PHARM D IN 2 MONTHS   We recommend signing up for the patient portal called MyChart.  Sign up information is provided on this After Visit Summary.  MyChart is used to connect with patients for Virtual Visits (Telemedicine).  Patients are able to view lab/test results, encounter notes, upcoming appointments, etc.  Non-urgent messages can be sent to your provider as well.   To learn more about what you can do with MyChart, go to [email or web address].   Other Instructions MONITOR YOUR BLOOD PRESSURE TWICE A DAY AND LOG BRING THE LOG AND YOUR BLOOD PRESSURE MACHINE TO YOUR FOLLOW. IF YOUR BLOOD PRESSURE IS CONSISTENTLY BELOW 130/80 YOU CAN CANCEL APPOINTMENT

## 2024-04-12 NOTE — Progress Notes (Signed)
 Cardiology Office Note:  .   Date:  04/12/2024  ID:  Jackee Chalk, DOB 12/28/1940, MRN [id number] PCP: Yolande Toribio MATSU, MD  [facility] Providers Cardiologist:  Aleene Passe, MD (Inactive)    History of Present Illness: .    Bruno Leach is a 83 y.o. male with mild carotid stenosis, hypertension and bradycardia here for follow up.  He was previously a patient of Dr. Calhoun.  He was previously seen for dizziness and bradycardia.  It was ultimately found to be an inner ear issue that improved with cleaning his ears.  He has occasional episodes of lightheadedness and has had an episode of syncope in 2021.  They worked on his blood pressure and lipids.  His last carotid Dopplers from 02/2024 revealed 1 to 39% stenosis bilaterally.  Discussed the use of AI scribe software for clinical note transcription with the patient, who gave verbal consent to proceed.  History of Present Illness Mr. Mortensen experiences elevated blood pressure, which he attributes to stress following his wife's recent passing. He does not regularly monitor his blood pressure at home but notes it tends to be higher during doctor visits. He is familiar with the names chlorthalidone , losartan , and metoprolol , which are prescribed for blood pressure control.  He describes numbness primarily in his right hand and some swelling and numbness in his right ankle and leg, persisting for six to eight months. 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 He has received injections for pain relief, which have been effective in reducing pain in his right hand and hip.  His diet consists mainly of home-cooked meals, focusing on salads, fruits, and protein sources like salmon. He avoids sugary cereals and is conscious of his salt intake. He used to exercise regularly at a fitness center but stopped in August due to his wife's health issues.  No chest pain,  pressure, or breathing difficulties. He notes swelling in his right ankle but has not observed if it decreases with leg elevation.  ROS:  As per HPI  Studies Reviewed: SABRA   EKG Interpretation Date/Time:  Thursday April 12 2024 08:17:12 EDT Ventricular Rate:  68 PR Interval:  170 QRS Duration:  80 QT Interval:  382 QTC Calculation: 406 R Axis:   20  Text Interpretation: Normal sinus rhythm Nonspecific ST abnormality When compared with ECG of 04-Feb-2023 14:15, Premature atrial complexes are no longer Present Confirmed by Raford Riggs ([id number]) on 04/12/2024 8:30:13 AM    Carotid Dopplers 03/19/2024: Summary:  Right Carotid: Velocities in the right ICA are consistent with a 1-39%  stenosis.   Left Carotid: Velocities in the left ICA are consistent with a 1-39%  stenosis.   Vertebrals: Bilateral vertebral arteries demonstrate antegrade flow.  Right              vertebral artery demonstrates high resistant flow.  Subclavians: Normal flow hemodynamics were seen in bilateral subclavian               arteries.    Risk Assessment/Calculations:             Physical Exam:   VS:  BP 134/64   Pulse 68   Ht 5' 6 (1.676 m)   Wt 152 lb 8 oz (69.2 kg)   SpO2 94%   BMI 24.61 kg/m  , BMI Body mass index is 24.61 kg/m. GENERAL:  Well appearing HEENT: Pupils equal round  and reactive, fundi not visualized, oral mucosa unremarkable NECK:  No jugular venous distention, waveform within normal limits, carotid upstroke brisk and symmetric, no bruits, no thyromegaly LUNGS:  Clear to auscultation bilaterally HEART:  RRR.  PMI not displaced or sustained,S1 and S2 within normal limits, no S3, no S4, no clicks, no rubs, no murmurs ABD:  Flat, positive bowel sounds normal in frequency in pitch, no bruits, no rebound, no guarding, no midline pulsatile mass, no hepatomegaly, no splenomegaly EXT:  2 plus pulses throughout, no edema, no cyanosis no clubbing SKIN:  No rashes no nodules NEURO:   Cranial nerves II through XII grossly intact, motor grossly intact throughout PSYCH:  Cognitively intact, oriented to person place and time   ASSESSMENT AND PLAN: .    Assessment & Plan # Right lower extremity swelling, evaluation for possible deep vein thrombosis Differential includes deep vein thrombosis and nerve-related swelling. - Order ultrasound of the right leg to evaluate for deep vein thrombosis.  # Primary hypertension Blood pressure elevated in office; possible white coat hypertension. Current medications: chlorthalidone , losartan , metoprolol . - Recommend purchasing a home blood pressure monitor. - Track blood pressure readings at home twice daily for a few weeks. - Provide sheets to record blood pressure readings. - Plan follow-up with pharmacist in two months to adjust medications if home readings are consistently above 130/80 mmHg. - Encourage resumption of regular exercise.  # Carotid artery stenosis, mild Mild stenosis confirmed by ultrasound. Continuing aspirin therapy. - Repeat carotid ultrasound in one year. - Continue aspirin therapy.  # Hyperlipidemia:  LDL well-controlled on rosuvastatin .            Dispo: f/u PharmD 2 months.  MD 1 year  Signed, Annabella Scarce, MD

## 2024-04-17 ENCOUNTER — Ambulatory Visit (HOSPITAL_COMMUNITY)
Admission: RE | Admit: 2024-04-17 | Discharge: 2024-04-17 | Disposition: A | Discharge status: Home / Self Care | Source: Ambulatory Visit | Attending: Cardiovascular Disease | Admitting: Cardiovascular Disease

## 2024-04-17 DIAGNOSIS — R2241 Localized swelling, mass and lump, right lower limb: Secondary | ICD-10-CM | POA: Insufficient documentation

## 2024-04-21 ENCOUNTER — Other Ambulatory Visit: Payer: Self-pay | Admitting: Nurse Practitioner

## 2024-04-21 DIAGNOSIS — I10 Essential (primary) hypertension: Secondary | ICD-10-CM

## 2024-04-21 DIAGNOSIS — I779 Disorder of arteries and arterioles, unspecified: Secondary | ICD-10-CM

## 2024-04-21 DIAGNOSIS — E782 Mixed hyperlipidemia: Secondary | ICD-10-CM

## 2024-04-23 ENCOUNTER — Other Ambulatory Visit: Payer: Self-pay | Admitting: Cardiovascular Disease

## 2024-04-23 DIAGNOSIS — I10 Essential (primary) hypertension: Secondary | ICD-10-CM

## 2024-04-23 DIAGNOSIS — E782 Mixed hyperlipidemia: Secondary | ICD-10-CM

## 2024-04-23 DIAGNOSIS — I779 Disorder of arteries and arterioles, unspecified: Secondary | ICD-10-CM

## 2024-04-24 ENCOUNTER — Other Ambulatory Visit: Payer: Self-pay | Admitting: Cardiovascular Disease

## 2024-04-24 DIAGNOSIS — E782 Mixed hyperlipidemia: Secondary | ICD-10-CM

## 2024-04-24 DIAGNOSIS — I10 Essential (primary) hypertension: Secondary | ICD-10-CM

## 2024-04-24 DIAGNOSIS — I779 Disorder of arteries and arterioles, unspecified: Secondary | ICD-10-CM

## 2024-04-25 MED ORDER — METOPROLOL SUCCINATE ER 25 MG PO TB24: 25.0000 mg | ORAL_TABLET | Freq: Every day | ORAL | 3 refills | Status: AC

## 2024-04-29 ENCOUNTER — Ambulatory Visit: Payer: Self-pay | Admitting: Cardiovascular Disease

## 2024-05-26 ENCOUNTER — Other Ambulatory Visit: Payer: Self-pay | Admitting: Physician Assistant

## 2024-05-26 ENCOUNTER — Other Ambulatory Visit: Payer: Self-pay | Admitting: Cardiovascular Disease

## 2024-06-06 ENCOUNTER — Encounter: Payer: Self-pay | Admitting: Pharmacist Clinician (PhC)/ Clinical Pharmacy Specialist

## 2024-06-06 ENCOUNTER — Ambulatory Visit: Attending: Cardiovascular Disease | Admitting: Pharmacist Clinician (PhC)/ Clinical Pharmacy Specialist

## 2024-06-06 VITALS — BP 152/68

## 2024-06-06 DIAGNOSIS — I10 Essential (primary) hypertension: Secondary | ICD-10-CM

## 2024-06-06 NOTE — Assessment & Plan Note (Signed)
 Assessment: BP is uncontrolled in office BP 152/68 mmHg;  above the goal (<130/80). Home readings average 124-126 systolic Tolerates chlorthalidone , losartan  and metoprolol  well, without any side effects Denies SOB, palpitation, chest pain, headaches,or swelling Reiterated the importance of regular exercise and low salt diet   Plan:  Continue taking chlorthalidone  25 mg daily, losartan  50 mg daily, metoprolol  succ 25 mg daily Patient to continue with home BP checks few times per week, if notices elevated will check daily for 3-4 days, contact office if any concerns. Take home BP device to next PCP appointment and have validated. Patient to follow up with Dr. Raford in 03/2025  Labs ordered today:  none

## 2024-06-06 NOTE — Patient Instructions (Signed)
 Follow up appointment: with Dr. Raford in October 2026  Take your BP meds as follows: no changes to your current medications  Check your blood pressure at home daily (if able) and keep record of the readings.  Your blood pressure goal is < 130/80  To check your pressure at home you will need to:  1. Sit up in a chair, with feet flat on the floor and back supported. Do not cross your ankles or legs. 2. Rest your left arm so that the cuff is about heart level. If the cuff goes on your upper arm,  then just relax the arm on the table, arm of the chair or your lap. If you have a wrist cuff, we  suggest relaxing your wrist against your chest (think of it as Pledging the Flag with the  wrong arm).  3. Place the cuff snugly around your arm, about 1 inch above the crook of your elbow. The  cords should be inside the groove of your elbow.  4. Sit quietly, with the cuff in place, for about 5 minutes. After that 5 minutes press the power  button to start a reading. 5. Do not talk or move while the reading is taking place.  6. Record your readings on a sheet of paper. Although most cuffs have a memory, it is often  easier to see a pattern developing when the numbers are all in front of you.  7. You can repeat the reading after 1-3 minutes if it is recommended  Make sure your bladder is empty and you have not had caffeine or tobacco within the last 30 min  Always bring your blood pressure log with you to your appointments. If you have not brought your monitor in to be double checked for accuracy, please bring it to your next appointment.  You can find a list of quality blood pressure cuffs at [email or web address]  Important lifestyle changes to control high blood pressure  Intervention  Effect on the BP  Lose extra pounds and watch your waistline Weight loss is one of the most effective lifestyle changes for controlling blood pressure. If you're overweight or obese, losing even a small amount of  weight can help reduce blood pressure. Blood pressure might go down by about 1 millimeter of mercury (mm Hg) with each kilogram (about 2.2 pounds) of weight lost.  Exercise regularly As a general goal, aim for at least 30 minutes of moderate physical activity every day. Regular physical activity can lower high blood pressure by about 5 to 8 mm Hg.  Eat a healthy diet Eating a diet rich in whole grains, fruits, vegetables, and low-fat dairy products and low in saturated fat and cholesterol. A healthy diet can lower high blood pressure by up to 11 mm Hg.  Reduce salt (sodium) in your diet Even a small reduction of sodium in the diet can improve heart health and reduce high blood pressure by about 5 to 6 mm Hg.  Limit alcohol One drink equals 12 ounces of beer, 5 ounces of wine, or 1.5 ounces of 80-proof liquor.  Limiting alcohol to less than one drink a day for women or two drinks a day for men can help lower blood pressure by about 4 mm Hg.   If you have any questions or concerns please use My Chart to send questions or call the office at [phone]

## 2024-06-06 NOTE — Progress Notes (Signed)
 Office Visit    Patient Name: Patrick Rios Date of Encounter: 06/06/2024  Primary Care Provider:  Yolande Toribio MATSU, MD Primary Cardiologist:  Annabella Scarce MD  Chief Complaint    Hypertension  Significant Past Medical History   Carotid stenosis Mild, continue with annual ultrasound, on ASA,   HLD 11/24 LDL 60 on rosuvastatin  10    Allergies[1]  History of Present Illness    Patrick Rios is a 83 y.o. male patient of Dr Scarce, in the office today for hypertension evaluation.  He was seen by Dr. Scarce in October, with an office reading of 134/64.  He noted that his pressure was usually higher at MD appointments, but had not been checking at home recently.  Has had some stresses/changes recently with health issues for his wife.  Dr. Scarce encouraged him to check home readings and resume exercise.  He was asked to follow up in 2 months.     Today he returns for follow up.  Notes that his wife passed away around the time he saw Dr. Scarce.   He is doing okay, went to [place]  to see his daughters for Thanksgiving.  No concerns with compliance or cost of medications.  Blood Pressure Goal:  130/80  Current Medications:  chlorthalidone  25 mg daily, losartan  50 mg daily, metoprolol  succ 25 mg daily  Family Hx:   mother had dementia, father lived to 6; 2 daughters, neither with known hypertension.    Social Hx:      Tobacco: no  Alcohol: quit about 8-10 years ago  Caffeine: 1 cup of coffee, occasional tea in the evening (hot, green)  Diet:  mostly home cooked meals, salad (kale or spinach) daily, blueberries daily, plenty of protein - chicken, turkey; rare pork or beef    Exercise: none recently, wanting to get back to gym  Home BP readings:  recently purchased home BP device; reviewed  via [employer] - likely [employer] device.  Checked twice daily for most of the past month, only had 8 of 50 readings > 130 systolic, highest at 150 (others were in the 130's).   The last 4 days all readings were in the 130's, he admits to having more stress this week. AM average (29 readings) 126/56 PM average  (27 readings) 124/53  Accessory Clinical Findings    Lab Results  Component Value Date   CREATININE 0.97 09/14/2019   BUN 25 09/14/2019   NA 140 09/14/2019   K 4.1 09/14/2019   CL 103 09/14/2019   CO2 24 09/14/2019   Lab Results  Component Value Date   ALT 16 05/09/2023   AST 34 03/26/2012   ALKPHOS 43 03/26/2012   BILITOT 0.5 03/26/2012   No results found for: HGBA1C  Home Medications    Current Outpatient Medications  Medication Sig Dispense Refill   aspirin EC 81 MG tablet Take 1 tablet by mouth daily.     cetirizine (ZYRTEC) 10 MG tablet Take 10 mg by mouth daily.     chlorthalidone  (HYGROTON ) 25 MG tablet Take 1 tablet (25 mg total) by mouth daily. 90 tablet 3   Cyanocobalamin (B-12 PO) Take 1 tablet by mouth daily.     losartan  (COZAAR ) 50 MG tablet Take 1 tablet (50 mg total) by mouth daily. 90 tablet 3   metoprolol  succinate (TOPROL  XL) 25 MG 24 hr tablet Take 1 tablet (25 mg total) by mouth daily. 90 tablet 3   Multiple Vitamins-Iron (DAILY MULTIVITAMINS/IRON PO) Take 1 tablet  by mouth daily.       pantoprazole (PROTONIX) 40 MG tablet Take 40 mg by mouth daily.     potassium chloride  (KLOR-CON ) 10 MEQ tablet Take 1 tablet (10 mEq total) by mouth daily. 90 tablet 3   Psyllium (METAMUCIL PO) Take by mouth 2 (two) times daily.     rosuvastatin  (CRESTOR ) 10 MG tablet Take 1 tablet (10 mg total) by mouth daily. 90 tablet 3   tadalafil (CIALIS) 5 MG tablet Take 5 mg by mouth daily as needed.     tamsulosin  (FLOMAX ) 0.4 MG CAPS capsule Take 0.4 mg by mouth at bedtime.     Vitamin D, Ergocalciferol, (DRISDOL) 1.25 MG (50000 UNIT) CAPS capsule Take 50,000 Units by mouth once a week.     Current Facility-Administered Medications  Medication Dose Route Frequency Provider Last Rate Last Admin   0.9 %  sodium chloride  infusion  500 mL  Intravenous Once Abran Norleen SAILOR, MD         HYPERTENSION CONTROL Vitals:   06/06/24 1446 06/06/24 1452  BP: (!) 150/74 (!) 152/68    The patient's blood pressure is elevated above target today.  In order to address the patient's elevated BP: Blood pressure will be monitored at home to determine if medication changes need to be made.; The blood pressure is usually elevated in clinic.  Blood pressures monitored at home have been optimal.      Assessment & Plan    Essential hypertension Assessment: BP is uncontrolled in office BP 152/68 mmHg;  above the goal (<130/80). Home readings average 124-126 systolic Tolerates chlorthalidone , losartan  and metoprolol  well, without any side effects Denies SOB, palpitation, chest pain, headaches,or swelling Reiterated the importance of regular exercise and low salt diet   Plan:  Continue taking chlorthalidone  25 mg daily, losartan  50 mg daily, metoprolol  succ 25 mg daily Patient to continue with home BP checks few times per week, if notices elevated will check daily for 3-4 days, contact office if any concerns. Take home BP device to next PCP appointment and have validated. Patient to follow up with Dr. Raford in 03/2025  Labs ordered today:  none   Allean Mink PharmD CPP [facility]  [address] [place], [place] [address] [phone]       [1]  Allergies Allergen Reactions   Pseudoephedrine Hcl     Other Reaction(s): Increased BPH   Aspirin Rash and Dermatitis    High dose

## 2024-07-11 ENCOUNTER — Telehealth: Payer: Self-pay | Admitting: Cardiovascular Disease

## 2024-07-11 NOTE — Telephone Encounter (Signed)
" °  Patient needs to update pharmacy in his records. He no  longer uses [facility] due to [employer]  [facility] [address], [place] [phone]   "

## 2024-07-11 NOTE — Telephone Encounter (Signed)
 Pharmacy has been updated.

## 2024-07-27 ENCOUNTER — Telehealth (HOSPITAL_BASED_OUTPATIENT_CLINIC_OR_DEPARTMENT_OTHER): Payer: Self-pay | Admitting: Cardiovascular Disease

## 2024-07-27 DIAGNOSIS — E782 Mixed hyperlipidemia: Secondary | ICD-10-CM

## 2024-07-27 DIAGNOSIS — I10 Essential (primary) hypertension: Secondary | ICD-10-CM

## 2024-07-27 DIAGNOSIS — I779 Disorder of arteries and arterioles, unspecified: Secondary | ICD-10-CM

## 2024-07-27 MED ORDER — ROSUVASTATIN CALCIUM 10 MG PO TABS: 10.0000 mg | ORAL_TABLET | Freq: Every day | ORAL | 0 refills | Status: AC

## 2024-07-27 NOTE — Telephone Encounter (Signed)
" °*  STAT* If patient is at the pharmacy, call can be transferred to refill team.   1. Which medications need to be refilled? (please list name of each medication and dose if known)  [id number] [place], [place] - [address]     2. Would you like to learn more about the convenience, safety, & potential cost savings by using the [facility]?    3. Are you open to using the [facility] ([facility].    4. Which pharmacy/location (including street and city if local pharmacy) is medication to be sent to?  rosuvastatin  (CRESTOR ) 10 MG tablet     5. Do they need a 30 day or 90 day supply? 90    Patient is out of meds "

## 2024-07-27 NOTE — Telephone Encounter (Signed)
 Rx sent to pharmacy
